# Patient Record
Sex: Female | Born: 1960 | Race: Black or African American | Hispanic: No | Marital: Single | Smoking: Never smoker
Health system: Southern US, Community
[De-identification: ages and names within clinical notes are randomized; demographics above are authoritative.]

## PROBLEM LIST (undated history)

## (undated) DIAGNOSIS — E8881 Metabolic syndrome: Secondary | ICD-10-CM

## (undated) DIAGNOSIS — Z5189 Encounter for other specified aftercare: Secondary | ICD-10-CM

## (undated) DIAGNOSIS — J45909 Unspecified asthma, uncomplicated: Secondary | ICD-10-CM

## (undated) DIAGNOSIS — R0981 Nasal congestion: Secondary | ICD-10-CM

## (undated) DIAGNOSIS — E119 Type 2 diabetes mellitus without complications: Secondary | ICD-10-CM

## (undated) DIAGNOSIS — R238 Other skin changes: Secondary | ICD-10-CM

## (undated) DIAGNOSIS — E7800 Pure hypercholesterolemia, unspecified: Secondary | ICD-10-CM

## (undated) DIAGNOSIS — D481 Neoplasm of uncertain behavior of connective and other soft tissue: Principal | ICD-10-CM

## (undated) DIAGNOSIS — T7840XA Allergy, unspecified, initial encounter: Secondary | ICD-10-CM

## (undated) DIAGNOSIS — E785 Hyperlipidemia, unspecified: Secondary | ICD-10-CM

## (undated) DIAGNOSIS — K219 Gastro-esophageal reflux disease without esophagitis: Secondary | ICD-10-CM

## (undated) DIAGNOSIS — H539 Unspecified visual disturbance: Secondary | ICD-10-CM

## (undated) DIAGNOSIS — R51 Headache: Secondary | ICD-10-CM

## (undated) DIAGNOSIS — R519 Headache, unspecified: Secondary | ICD-10-CM

## (undated) DIAGNOSIS — I10 Essential (primary) hypertension: Secondary | ICD-10-CM

## (undated) DIAGNOSIS — R233 Spontaneous ecchymoses: Secondary | ICD-10-CM

## (undated) DIAGNOSIS — J029 Acute pharyngitis, unspecified: Secondary | ICD-10-CM

## (undated) HISTORY — DX: Nasal congestion: R09.81

## (undated) HISTORY — DX: Acute pharyngitis, unspecified: J02.9

## (undated) HISTORY — DX: Metabolic syndrome: E88.810

## (undated) HISTORY — DX: Pure hypercholesterolemia, unspecified: E78.00

## (undated) HISTORY — DX: Neoplasm of uncertain behavior of connective and other soft tissue: D48.1

## (undated) HISTORY — DX: Other skin changes: R23.8

## (undated) HISTORY — DX: Headache: R51

## (undated) HISTORY — DX: Unspecified visual disturbance: H53.9

## (undated) HISTORY — DX: Essential (primary) hypertension: I10

## (undated) HISTORY — DX: Gastro-esophageal reflux disease without esophagitis: K21.9

## (undated) HISTORY — DX: Metabolic syndrome: E88.81

## (undated) HISTORY — DX: Headache, unspecified: R51.9

## (undated) HISTORY — DX: Allergy, unspecified, initial encounter: T78.40XA

## (undated) HISTORY — DX: Encounter for other specified aftercare: Z51.89

## (undated) HISTORY — DX: Hyperlipidemia, unspecified: E78.5

## (undated) HISTORY — DX: Unspecified asthma, uncomplicated: J45.909

## (undated) HISTORY — DX: Spontaneous ecchymoses: R23.3

## (undated) HISTORY — DX: Type 2 diabetes mellitus without complications: E11.9

---

## 1986-07-03 HISTORY — PX: ABDOMINAL HYSTERECTOMY: SHX81

## 2000-09-03 ENCOUNTER — Other Ambulatory Visit: Admission: RE | Admit: 2000-09-03 | Discharge: 2000-09-03 | Payer: Self-pay | Admitting: Obstetrics and Gynecology

## 2001-04-02 ENCOUNTER — Emergency Department (HOSPITAL_COMMUNITY): Admission: EM | Admit: 2001-04-02 | Discharge: 2001-04-02 | Payer: Self-pay | Admitting: Emergency Medicine

## 2001-04-02 ENCOUNTER — Encounter: Payer: Self-pay | Admitting: Emergency Medicine

## 2002-01-29 ENCOUNTER — Emergency Department (HOSPITAL_COMMUNITY): Admission: EM | Admit: 2002-01-29 | Discharge: 2002-01-29 | Payer: Self-pay | Admitting: Emergency Medicine

## 2004-10-10 ENCOUNTER — Encounter: Admission: RE | Admit: 2004-10-10 | Discharge: 2004-11-08 | Payer: Self-pay | Admitting: Orthopedic Surgery

## 2005-07-28 ENCOUNTER — Ambulatory Visit (HOSPITAL_COMMUNITY): Admission: RE | Admit: 2005-07-28 | Discharge: 2005-07-28 | Payer: Self-pay | Admitting: Gastroenterology

## 2007-08-02 ENCOUNTER — Encounter: Admission: RE | Admit: 2007-08-02 | Discharge: 2007-08-02 | Payer: Self-pay | Admitting: Internal Medicine

## 2007-08-14 ENCOUNTER — Encounter: Admission: RE | Admit: 2007-08-14 | Discharge: 2007-08-14 | Payer: Self-pay

## 2009-12-20 ENCOUNTER — Emergency Department (HOSPITAL_COMMUNITY): Admission: EM | Admit: 2009-12-20 | Discharge: 2009-12-21 | Payer: Self-pay | Admitting: Emergency Medicine

## 2011-03-30 ENCOUNTER — Emergency Department (HOSPITAL_COMMUNITY): Payer: 59

## 2011-03-30 ENCOUNTER — Emergency Department (HOSPITAL_COMMUNITY)
Admission: EM | Admit: 2011-03-30 | Discharge: 2011-03-30 | Disposition: A | Discharge status: Home / Self Care | Payer: 59 | Attending: Emergency Medicine | Admitting: Emergency Medicine

## 2011-03-30 DIAGNOSIS — R079 Chest pain, unspecified: Secondary | ICD-10-CM | POA: Insufficient documentation

## 2011-03-30 DIAGNOSIS — F29 Unspecified psychosis not due to a substance or known physiological condition: Secondary | ICD-10-CM | POA: Insufficient documentation

## 2011-03-30 DIAGNOSIS — R51 Headache: Secondary | ICD-10-CM | POA: Insufficient documentation

## 2011-03-30 DIAGNOSIS — I10 Essential (primary) hypertension: Secondary | ICD-10-CM | POA: Insufficient documentation

## 2011-03-30 DIAGNOSIS — E785 Hyperlipidemia, unspecified: Secondary | ICD-10-CM | POA: Insufficient documentation

## 2011-03-30 DIAGNOSIS — Z79899 Other long term (current) drug therapy: Secondary | ICD-10-CM | POA: Insufficient documentation

## 2011-03-30 LAB — DIFFERENTIAL
Basophils Absolute: 0 10*3/uL (ref 0.0–0.1)
Basophils Relative: 0 % (ref 0–1)
Monocytes Absolute: 0.4 10*3/uL (ref 0.1–1.0)
Neutro Abs: 2.7 10*3/uL (ref 1.7–7.7)
Neutrophils Relative %: 43 % (ref 43–77)

## 2011-03-30 LAB — CBC
Hemoglobin: 12.3 g/dL (ref 12.0–15.0)
MCHC: 32.8 g/dL (ref 30.0–36.0)
Platelets: 273 10*3/uL (ref 150–400)

## 2011-03-30 LAB — COMPREHENSIVE METABOLIC PANEL
ALT: 14 U/L (ref 0–35)
AST: 17 U/L (ref 0–37)
Albumin: 3.5 g/dL (ref 3.5–5.2)
Alkaline Phosphatase: 108 U/L (ref 39–117)
GFR calc Af Amer: >60 mL/min (ref >60)
Glucose, Bld: 95 mg/dL (ref 70–99)
Potassium: 3.7 mEq/L (ref 3.5–5.1)
Sodium: 139 mEq/L (ref 135–145)
Total Protein: 7.8 g/dL (ref 6.0–8.3)

## 2011-03-30 LAB — POCT I-STAT TROPONIN I: Troponin i, poc: 0 ng/mL (ref 0.00–0.08)

## 2011-07-31 ENCOUNTER — Ambulatory Visit (INDEPENDENT_AMBULATORY_CARE_PROVIDER_SITE_OTHER): Payer: 59 | Admitting: Surgery

## 2011-07-31 ENCOUNTER — Encounter (INDEPENDENT_AMBULATORY_CARE_PROVIDER_SITE_OTHER): Payer: Self-pay | Admitting: Surgery

## 2011-07-31 VITALS — BP 154/86 | HR 72 | Temp 98.1°F | Resp 18 | Ht 67.0 in | Wt 294.6 lb

## 2011-07-31 DIAGNOSIS — D4819 Other specified neoplasm of uncertain behavior of connective and other soft tissue: Secondary | ICD-10-CM | POA: Insufficient documentation

## 2011-07-31 DIAGNOSIS — R229 Localized swelling, mass and lump, unspecified: Secondary | ICD-10-CM

## 2011-07-31 DIAGNOSIS — D481 Neoplasm of uncertain behavior of connective and other soft tissue: Secondary | ICD-10-CM

## 2011-07-31 DIAGNOSIS — E66813 Obesity, class 3: Secondary | ICD-10-CM

## 2011-07-31 HISTORY — DX: Other specified neoplasm of uncertain behavior of connective and other soft tissue: D48.19

## 2011-07-31 HISTORY — DX: Neoplasm of uncertain behavior of connective and other soft tissue: D48.1

## 2011-07-31 NOTE — Progress Notes (Signed)
Subjective:     Patient ID: Annette Lindsey, female   DOB: Dec 01, 1960, 51 y.o.   MRN: [id number]  HPI  Annette Lindsey  February 25, 1961 [id number]  Patient Care Team: Geraldo Pitter, MD as PCP - General (Family Medicine)  This patient is a 51 y.o.female who presents today for surgical evaluation at the request of Dr. Parke Simmers.   Reason for visit: Enlarging mass on the right buttock.  Patient is a pleasant obese woman who is felt a mass on her right buttock about 8 months ago. It is gradually gotten larger. It is getting painful. No bleeding or discharge. She's never had a lesion like this before. No history of keloids or scarring. No history of MRSA or other infections. No trauma or fall. No history of lymphadenopathy. Energy level otherwise rather good. She comes today with a significant other  Patient Active Problem List  Diagnoses  . Skin mass, right lateral buttock  . Obesity, Class III, BMI 40-49.9 (morbid obesity)    Past Medical History  Diagnosis Date  . Hypertension   . Hyperlipidemia   . Nasal congestion   . Sore throat   . Visual disturbance   . Chest pain   . Constipation   . Generalized headaches   . Bruises easily     Past Surgical History  Procedure Date  . Abdominal hysterectomy 1988    History   Social History  . Marital Status: Single    Spouse Name: N/A    Number of Children: N/A  . Years of Education: N/A   Occupational History  . Not on file.   Social History Main Topics  . Smoking status: Never Smoker   . Smokeless tobacco: Never Used  . Alcohol Use: No  . Drug Use: No  . Sexually Active: Not on file   Other Topics Concern  . Not on file   Social History Narrative  . No narrative on file    Family History  Problem Relation Age of Onset  . Cancer Father     lung    Current outpatient prescriptions:Amlodipine Besylate-Valsartan (EXFORGE PO), Take 10 mg by mouth daily., Disp: , Rfl:   Allergies  Allergen Reactions  . Other  Anaphylaxis    Patient has this reaction to grass. 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 Shellfish-Derived Products Swelling    Of throat    BP 154/86  Pulse 72  Temp(Src) 98.1 F (36.7 C) (Temporal)  Resp 18  Ht 5\' 7"  (1.702 m)  Wt 294 lb 9.6 oz (133.63 kg)  BMI 46.14 kg/m2     Review of Systems  Constitutional: Negative for fever, chills, diaphoresis, appetite change and fatigue.  HENT: Negative for ear pain, sore throat, trouble swallowing, neck pain and ear discharge.   Eyes: Negative for photophobia, discharge and visual disturbance.  Respiratory: Negative for cough, choking, chest tightness and shortness of breath.   Cardiovascular: Negative for chest pain and palpitations.  Gastrointestinal: Negative for nausea, vomiting, abdominal pain, diarrhea, constipation, anal bleeding and rectal pain.  Genitourinary: Negative for dysuria, frequency and difficulty urinating.  Musculoskeletal: Negative for myalgias and gait problem.  Skin: Negative for color change, pallor, rash and wound.       No h/o keloids, hypertrophic scarring  Neurological: Negative for dizziness, speech difficulty, weakness and numbness.  Hematological: Negative for adenopathy.  Psychiatric/Behavioral: Negative for confusion and agitation. The patient is not nervous/anxious.  Objective:   Physical Exam  Constitutional: She is oriented to person, place, and time. She appears well-developed and well-nourished. No distress.  HENT:  Head: Normocephalic.  Mouth/Throat: Oropharynx is clear and moist. No oropharyngeal exudate.  Eyes: Conjunctivae and EOM are normal. Pupils are equal, round, and reactive to light. No scleral icterus.  Neck: Normal range of motion. Neck supple. No tracheal deviation present.  Cardiovascular: Normal rate, regular rhythm and intact distal pulses.   Pulmonary/Chest: Effort normal and breath sounds normal. No respiratory distress.  She exhibits no tenderness.  Abdominal: Soft. She exhibits no distension and no mass. There is no tenderness. Hernia confirmed negative in the right inguinal area and confirmed negative in the left inguinal area.       Pfannenstiel incision closed - no keloid  Genitourinary: No vaginal discharge found.  Musculoskeletal: Normal range of motion. She exhibits no tenderness.  Lymphadenopathy:    She has no cervical adenopathy.       Right: No inguinal adenopathy present.       Left: No inguinal adenopathy present.  Neurological: She is alert and oriented to person, place, and time. No cranial nerve deficit. She exhibits normal muscle tone. Coordination normal.  Skin: Skin is warm and dry. No rash noted. She is not diaphoretic. No cyanosis or erythema. Nails show no clubbing.     Psychiatric: She has a normal mood and affect. Her behavior is normal. Judgment and thought content normal.       Assessment:     Enlarging mass of skin, prob fibroma (not c/w cyst or lipoma or nevus)    Plan:     Removal.  Given it's location & size, the pt wishes some sedation  The pathophysiology of skin & subcutaneous masses was discussed.  Natural history risks without surgery were discussed.  I recommended surgery to remove the mass.  I explained the technique of removal with use of local anesthesia & possible need for more aggressive sedation/anesthesia for patient comfort.    Risks such as bleeding, infection, heart attack, death, and other risks were discussed.  I noted a good likelihood this will help address the problem.   Possibility that this will not correct all symptoms was explained. Possibility of regrowth/recurrence of the mass was discussed.  We will work to minimize complications. Questions were answered.  The patient expresses understanding & wishes to proceed with surgery.

## 2011-08-04 ENCOUNTER — Encounter (HOSPITAL_COMMUNITY): Payer: Self-pay | Admitting: *Deleted

## 2011-08-04 ENCOUNTER — Emergency Department (HOSPITAL_COMMUNITY)
Admission: EM | Admit: 2011-08-04 | Discharge: 2011-08-04 | Disposition: A | Discharge status: Home / Self Care | Payer: No Typology Code available for payment source | Attending: Emergency Medicine | Admitting: Emergency Medicine

## 2011-08-04 DIAGNOSIS — T148XXA Other injury of unspecified body region, initial encounter: Secondary | ICD-10-CM | POA: Insufficient documentation

## 2011-08-04 DIAGNOSIS — E785 Hyperlipidemia, unspecified: Secondary | ICD-10-CM | POA: Insufficient documentation

## 2011-08-04 DIAGNOSIS — Y92410 Unspecified street and highway as the place of occurrence of the external cause: Secondary | ICD-10-CM | POA: Insufficient documentation

## 2011-08-04 DIAGNOSIS — M62838 Other muscle spasm: Secondary | ICD-10-CM | POA: Insufficient documentation

## 2011-08-04 DIAGNOSIS — I10 Essential (primary) hypertension: Secondary | ICD-10-CM | POA: Insufficient documentation

## 2011-08-04 DIAGNOSIS — R51 Headache: Secondary | ICD-10-CM | POA: Insufficient documentation

## 2011-08-04 DIAGNOSIS — M25519 Pain in unspecified shoulder: Secondary | ICD-10-CM | POA: Insufficient documentation

## 2011-08-04 DIAGNOSIS — M542 Cervicalgia: Secondary | ICD-10-CM | POA: Insufficient documentation

## 2011-08-04 MED ORDER — METHOCARBAMOL 500 MG PO TABS: 1000.0000 mg | ORAL_TABLET | Freq: Four times a day (QID) | ORAL | Status: AC

## 2011-08-04 MED ORDER — IBUPROFEN 800 MG PO TABS
800.0000 mg | ORAL_TABLET | Freq: Once | ORAL | Status: AC
  Administered 2011-08-04: 800 mg via ORAL
  Filled 2011-08-04: qty 1

## 2011-08-04 MED ORDER — IBUPROFEN 800 MG PO TABS: 800.0000 mg | ORAL_TABLET | Freq: Three times a day (TID) | ORAL | Status: AC | PRN
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
## 2011-08-05 NOTE — ED Provider Notes (Signed)
Medical screening examination/treatment/procedure(s) were performed by non-physician practitioner and as supervising physician I was immediately available for consultation/collaboration. Mayzie Caughlin Y.   Gavin Pound. Man Effertz, MD 08/05/11 2302

## 2011-08-18 DIAGNOSIS — D215 Benign neoplasm of connective and other soft tissue of pelvis: Secondary | ICD-10-CM

## 2011-08-18 HISTORY — PX: MASS EXCISION: SHX2000

## 2011-08-21 ENCOUNTER — Telehealth (INDEPENDENT_AMBULATORY_CARE_PROVIDER_SITE_OTHER): Payer: Self-pay

## 2011-08-21 NOTE — Telephone Encounter (Signed)
LMOM giving her po appt with Dr Michaell Cowing but advised if she has stitches or staples in to call me back b/c I would move her appt up earlier if I need to.

## 2011-09-05 ENCOUNTER — Encounter (INDEPENDENT_AMBULATORY_CARE_PROVIDER_SITE_OTHER): Payer: Self-pay | Admitting: Surgery

## 2011-09-12 ENCOUNTER — Encounter (INDEPENDENT_AMBULATORY_CARE_PROVIDER_SITE_OTHER): Payer: Self-pay

## 2011-09-12 ENCOUNTER — Ambulatory Visit (INDEPENDENT_AMBULATORY_CARE_PROVIDER_SITE_OTHER): Payer: 59 | Admitting: Surgery

## 2011-09-12 ENCOUNTER — Encounter (INDEPENDENT_AMBULATORY_CARE_PROVIDER_SITE_OTHER): Payer: Self-pay | Admitting: Surgery

## 2011-09-12 VITALS — BP 138/86 | HR 108 | Temp 97.2°F | Resp 18 | Ht 67.0 in | Wt 296.2 lb

## 2011-09-12 DIAGNOSIS — D481 Neoplasm of uncertain behavior of connective and other soft tissue: Secondary | ICD-10-CM

## 2011-09-12 NOTE — Progress Notes (Signed)
Subjective:     Patient ID: Annette Lindsey, female   DOB: 1960-12-27, 51 y.o.   MRN: [id number]  HPI   DANIELL PARADISE  Dec 11, 1960 [id number]  Patient Care Team: Geraldo Pitter, MD as PCP - General (Family Medicine)  This patient is a 52 y.o.female who presents today for surgical evaluation at the request of Dr. Parke Simmers.   Reason for visit: Enlarging mass on the right buttock.  Patient is a pleasant obese woman who is felt a mass on her right buttock about 8 months ago. It is gradually gotten larger. It is getting painful. No bleeding or discharge. She's never had a lesion like this before. No history of keloids or scarring. No history of MRSA or other infections. No trauma or fall. No history of lymphadenopathy. Energy level otherwise rather good. She comes today with a significant other  Patient Active Problem List  Diagnoses  . Atypical fibroxanthoma right buttock  . Obesity, Class III, BMI 40-49.9 (morbid obesity)    Past Medical History  Diagnosis Date  . Hypertension   . Hyperlipidemia   . Nasal congestion   . Sore throat   . Visual disturbance   . Chest pain   . Constipation   . Generalized headaches   . Bruises easily   . 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 Abdominal hysterectomy 1988  . Mass excision 08/18/11    right buttock    History   Social History  . Marital Status: Single    Spouse Name: N/A    Number of Children: N/A  . Years of Education: N/A   Occupational History  . Not on file.   Social History Main Topics  . Smoking status: Never Smoker   . Smokeless tobacco: Never Used  . Alcohol Use: No  . Drug Use: No  . Sexually Active: Not on file   Other Topics Concern  . Not on file   Social History Narrative  . No narrative on file    Family History  Problem Relation Age of Onset  . Cancer Father     lung  . Heart disease Mother     Current outpatient  prescriptions:Amlodipine Besylate-Valsartan (EXFORGE PO), Take 10 mg by mouth daily., Disp: , Rfl: ;  atorvastatin (LIPITOR) 40 MG tablet, Take 40 mg by mouth daily., Disp: , Rfl:   Allergies  Allergen Reactions  . Other Anaphylaxis    Patient has this reaction to grass. 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 Shellfish-Derived Products Swelling    Of throat    BP 138/86  Pulse 108  Temp(Src) 97.2 F (36.2 C) (Temporal)  Resp 18  Ht 5\' 7"  (1.702 m)  Wt 296 lb 3.2 oz (134.355 kg)  BMI 46.39 kg/m2     Review of Systems  Constitutional: Negative for fever, chills, diaphoresis, appetite change and fatigue.  HENT: Negative for ear pain, sore throat, trouble swallowing, neck pain and ear discharge.   Eyes: Negative for photophobia, discharge and visual disturbance.  Respiratory: Negative for cough, choking, chest tightness and shortness of breath.   Cardiovascular: Negative for chest pain and palpitations.  Gastrointestinal: Negative for nausea, vomiting, abdominal pain, diarrhea, constipation, anal bleeding and rectal pain.  Genitourinary: Negative for dysuria, frequency and difficulty urinating.  Musculoskeletal: Negative for myalgias and gait problem.  Skin: Negative for color change, pallor, rash and wound.  No h/o keloids, hypertrophic scarring  Neurological: Negative for dizziness, speech difficulty, weakness and numbness.  Hematological: Negative for adenopathy.  Psychiatric/Behavioral: Negative for confusion and agitation. The patient is not nervous/anxious.        Objective:   Physical Exam  Constitutional: She is oriented to person, place, and time. She appears well-developed and well-nourished. No distress.  HENT:  Head: Normocephalic.  Mouth/Throat: Oropharynx is clear and moist. No oropharyngeal exudate.  Eyes: Conjunctivae and EOM are normal. Pupils are equal, round, and reactive to light. No scleral icterus.    Neck: Normal range of motion. Neck supple. No tracheal deviation present.  Cardiovascular: Normal rate, regular rhythm and intact distal pulses.   Pulmonary/Chest: Effort normal and breath sounds normal. No respiratory distress. She exhibits no tenderness.  Abdominal: Soft. She exhibits no distension and no mass. There is no tenderness. Hernia confirmed negative in the right inguinal area and confirmed negative in the left inguinal area.       Pfannenstiel incision closed - no keloid  Genitourinary: No vaginal discharge found.  Musculoskeletal: Normal range of motion. She exhibits no tenderness.  Lymphadenopathy:    She has no cervical adenopathy.       Right: No inguinal adenopathy present.       Left: No inguinal adenopathy present.  Neurological: She is alert and oriented to person, place, and time. No cranial nerve deficit. She exhibits normal muscle tone. Coordination normal.  Skin: Skin is warm and dry. No rash noted. She is not diaphoretic. No cyanosis or erythema. Nails show no clubbing.     Psychiatric: She has a normal mood and affect. Her behavior is normal. Judgment and thought content normal.       Assessment:     Atypical fibroxanthoma of right gluteal region    Plan:     It doesn't seem that this is typical for a woman of African descent (tends to be elderly of European descent). It is also not in any sun-exposed area. Nonetheless the pathology was re-reviewed  In reviewing the literature, the patient is best served with better margins. Review of the literature notes that [facility] notes 2cm margins are best. It is in a location where I can do that. I do not think she will require Moh's Surgery as I can have enough tissue to get better margins. Lymph node metastasis risk is very small. She wishes to proceed with surgery.  Given it's location & size, the pt wishes some sedation  The pathophysiology of skin & subcutaneous masses was discussed.  Natural history risks without  surgery were discussed.  I recommended surgery to remove the mass.  I explained the technique of removal with use of local anesthesia & possible need for more aggressive sedation/anesthesia for patient comfort.    Risks such as bleeding, infection, heart attack, death, and other risks were discussed.  I noted a good likelihood this will help address the problem.   Possibility that this will not correct all symptoms was explained. Possibility of regrowth/recurrence of the mass was discussed.  We will work to minimize complications. Questions were answered.  The patient expresses understanding & wishes to proceed with surgery.

## 2011-09-12 NOTE — Patient Instructions (Signed)
You have a ATYPICAL FIBROXANTHOMA.  It requires better surgical margins = you need re-excision

## 2011-09-19 ENCOUNTER — Other Ambulatory Visit (INDEPENDENT_AMBULATORY_CARE_PROVIDER_SITE_OTHER): Payer: Self-pay | Admitting: Surgery

## 2011-09-19 DIAGNOSIS — D4819 Other specified neoplasm of uncertain behavior of connective and other soft tissue: Secondary | ICD-10-CM

## 2011-09-19 DIAGNOSIS — D481 Neoplasm of uncertain behavior of connective and other soft tissue: Secondary | ICD-10-CM

## 2011-09-21 ENCOUNTER — Encounter (INDEPENDENT_AMBULATORY_CARE_PROVIDER_SITE_OTHER): Payer: Self-pay

## 2011-09-21 ENCOUNTER — Telehealth (INDEPENDENT_AMBULATORY_CARE_PROVIDER_SITE_OTHER): Payer: Self-pay | Admitting: General Surgery

## 2011-09-21 NOTE — Telephone Encounter (Signed)
Message copied by Liliana Cline on Thu Sep 21, 2011  5:33 PM ------      Message from: Ardeth Sportsman      Created: Thu Sep 21, 2011  5:13 PM       Tell pt the good news!  Margins are clear

## 2011-09-21 NOTE — Telephone Encounter (Signed)
Patient made aware of path results. Will follow up at appt and call with any questions prior.  

## 2011-10-09 ENCOUNTER — Encounter (INDEPENDENT_AMBULATORY_CARE_PROVIDER_SITE_OTHER): Payer: Self-pay | Admitting: Surgery

## 2011-10-09 ENCOUNTER — Ambulatory Visit (INDEPENDENT_AMBULATORY_CARE_PROVIDER_SITE_OTHER): Payer: 59 | Admitting: Surgery

## 2011-10-09 VITALS — BP 138/90 | HR 84 | Temp 97.6°F | Resp 18 | Ht 67.0 in | Wt 298.2 lb

## 2011-10-09 DIAGNOSIS — D481 Neoplasm of uncertain behavior of connective and other soft tissue: Secondary | ICD-10-CM

## 2011-10-09 NOTE — Progress Notes (Signed)
Subjective:     Patient ID: Annette Lindsey, female   DOB: [birth year], 51 y.o.   MRN: [id number]  HPI   [name]  June 17, 1961 [id number]  Patient Care Team: Geraldo Pitter, MD as PCP - General (Family Medicine)  This patient is a 51 y.o.female who presents today for surgical evaluation at the request of Dr. Parke Simmers.   Reason for visit: Enlarging mass on the right buttock.  Pathology: Fibroxanthoma  Procedure: Reexcision of fibroxanthoma R buttock margins 09/19/2011  The patient feels much better. Mild soreness resolving.   Wound not opened up. Walking well. Back to work. She can sit on the area.  Patient Active Problem List  Diagnoses  . Atypical fibroxanthoma right buttock  . Obesity, Class III, BMI 40-49.9 (morbid obesity)    Past Medical History  Diagnosis Date  . Hypertension   . Hyperlipidemia   . Nasal congestion   . Sore throat   . Visual disturbance   . Chest pain   . Constipation   . Generalized headaches   . Bruises easily   . 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 Abdominal hysterectomy 1988  . Mass excision 08/18/11    right buttock    History   Social History  . Marital Status: Single    Spouse Name: N/A    Number of Children: N/A  . Years of Education: N/A   Occupational History  . Not on file.   Social History Main Topics  . Smoking status: Never Smoker   . Smokeless tobacco: Never Used  . Alcohol Use: No  . Drug Use: No  . Sexually Active: Not on file   Other Topics Concern  . Not on file   Social History Narrative  . No narrative on file    Family History  Problem Relation Age of Onset  . Cancer Father     lung  . Heart disease Mother     Current outpatient prescriptions:Amlodipine Besylate-Valsartan (EXFORGE PO), Take 10 mg by mouth daily., Disp: , Rfl: ;  Rosuvastatin Calcium (CRESTOR PO), Take by mouth daily., Disp: , Rfl:   Allergies  Allergen Reactions  .  Other Anaphylaxis    Patient has this reaction to grass. 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 Shellfish-Derived Products Swelling    Of throat    BP 138/90  Pulse 84  Temp(Src) 97.6 F (36.4 C) (Temporal)  Resp 18  Ht 5\' 7"  (1.702 m)  Wt 298 lb 3.2 oz (135.263 kg)  BMI 46.70 kg/m2     Review of Systems  Constitutional: Negative for fever, chills, diaphoresis, appetite change and fatigue.  HENT: Negative for ear pain, sore throat, trouble swallowing, neck pain and ear discharge.   Eyes: Negative for photophobia, discharge and visual disturbance.  Respiratory: Negative for cough, choking, chest tightness and shortness of breath.   Cardiovascular: Negative for chest pain and palpitations.  Gastrointestinal: Negative for nausea, vomiting, abdominal pain, diarrhea, constipation, anal bleeding and rectal pain.  Genitourinary: Negative for dysuria, frequency and difficulty urinating.  Musculoskeletal: Negative for myalgias and gait problem.  Skin: Negative for color change, pallor, rash and wound.       No h/o keloids, hypertrophic scarring  Neurological: Negative for dizziness, speech difficulty, weakness and numbness.  Hematological: Negative for adenopathy.  Psychiatric/Behavioral: Negative for confusion and agitation. The patient is not nervous/anxious.  Objective:   Physical Exam  Constitutional: She is oriented to person, place, and time. She appears well-developed and well-nourished. No distress.  HENT:  Head: Normocephalic.  Mouth/Throat: Oropharynx is clear and moist. No oropharyngeal exudate.  Eyes: Conjunctivae and EOM are normal. Pupils are equal, round, and reactive to light. No scleral icterus.  Neck: Normal range of motion. Neck supple. No tracheal deviation present.  Cardiovascular: Normal rate, regular rhythm and intact distal pulses.   Pulmonary/Chest: Effort normal and breath sounds normal. No respiratory  distress. She exhibits no tenderness.  Abdominal: Soft. She exhibits no distension and no mass. There is no tenderness. Hernia confirmed negative in the right inguinal area and confirmed negative in the left inguinal area.       Pfannenstiel incision closed - no keloid  Genitourinary: No vaginal discharge found.  Musculoskeletal: Normal range of motion. She exhibits no tenderness.  Lymphadenopathy:    She has no cervical adenopathy.       Right: No inguinal adenopathy present.       Left: No inguinal adenopathy present.  Neurological: She is alert and oriented to person, place, and time. No cranial nerve deficit. She exhibits normal muscle tone. Coordination normal.  Skin: Skin is warm and dry. No rash noted. She is not diaphoretic. No cyanosis or erythema. Nails show no clubbing.     Psychiatric: She has a normal mood and affect. Her behavior is normal. Judgment and thought content normal.       Assessment:     Atypical fibroxanthoma of right gluteal region, margin >2cm now & clear    Plan:     Increase activity as tolerated.  Do not push through pain.  Exam of the skin/scar regularly. Make sure there is no recurrence in the future.  Return to clinic p.r.n. The patient expressed understanding and appreciation

## 2012-05-09 ENCOUNTER — Other Ambulatory Visit: Payer: Self-pay | Admitting: Family Medicine

## 2012-05-09 ENCOUNTER — Ambulatory Visit
Admission: RE | Admit: 2012-05-09 | Discharge: 2012-05-09 | Disposition: A | Discharge status: Home / Self Care | Payer: 59 | Source: Ambulatory Visit | Attending: Family Medicine | Admitting: Family Medicine

## 2012-05-09 DIAGNOSIS — R7611 Nonspecific reaction to tuberculin skin test without active tuberculosis: Secondary | ICD-10-CM

## 2012-07-23 ENCOUNTER — Emergency Department (HOSPITAL_COMMUNITY): Payer: 59

## 2012-07-23 ENCOUNTER — Emergency Department (HOSPITAL_COMMUNITY)
Admission: EM | Admit: 2012-07-23 | Discharge: 2012-07-23 | Disposition: A | Discharge status: Home / Self Care | Payer: 59 | Attending: Emergency Medicine | Admitting: Emergency Medicine

## 2012-07-23 ENCOUNTER — Encounter (HOSPITAL_COMMUNITY): Payer: Self-pay | Admitting: *Deleted

## 2012-07-23 DIAGNOSIS — Z9119 Patient's noncompliance with other medical treatment and regimen: Secondary | ICD-10-CM | POA: Insufficient documentation

## 2012-07-23 DIAGNOSIS — I160 Hypertensive urgency: Secondary | ICD-10-CM

## 2012-07-23 DIAGNOSIS — Z79899 Other long term (current) drug therapy: Secondary | ICD-10-CM | POA: Insufficient documentation

## 2012-07-23 DIAGNOSIS — Z8669 Personal history of other diseases of the nervous system and sense organs: Secondary | ICD-10-CM | POA: Insufficient documentation

## 2012-07-23 DIAGNOSIS — E785 Hyperlipidemia, unspecified: Secondary | ICD-10-CM | POA: Insufficient documentation

## 2012-07-23 DIAGNOSIS — Z8709 Personal history of other diseases of the respiratory system: Secondary | ICD-10-CM | POA: Insufficient documentation

## 2012-07-23 DIAGNOSIS — Z8719 Personal history of other diseases of the digestive system: Secondary | ICD-10-CM | POA: Insufficient documentation

## 2012-07-23 DIAGNOSIS — Z3202 Encounter for pregnancy test, result negative: Secondary | ICD-10-CM | POA: Insufficient documentation

## 2012-07-23 DIAGNOSIS — R209 Unspecified disturbances of skin sensation: Secondary | ICD-10-CM | POA: Insufficient documentation

## 2012-07-23 DIAGNOSIS — Z91199 Patient's noncompliance with other medical treatment and regimen due to unspecified reason: Secondary | ICD-10-CM | POA: Insufficient documentation

## 2012-07-23 DIAGNOSIS — I10 Essential (primary) hypertension: Secondary | ICD-10-CM | POA: Insufficient documentation

## 2012-07-23 DIAGNOSIS — R0789 Other chest pain: Secondary | ICD-10-CM | POA: Insufficient documentation

## 2012-07-23 LAB — CBC
HCT: 40.5 % (ref 36.0–46.0)
MCH: 26.8 pg (ref 26.0–34.0)
MCHC: 32.6 g/dL (ref 30.0–36.0)
MCV: 82.2 fL (ref 78.0–100.0)
RDW: 14.6 % (ref 11.5–15.5)

## 2012-07-23 LAB — URINALYSIS, ROUTINE W REFLEX MICROSCOPIC
Glucose, UA: NEGATIVE mg/dL
Hgb urine dipstick: NEGATIVE
Ketones, ur: NEGATIVE mg/dL
Leukocytes, UA: NEGATIVE
Protein, ur: NEGATIVE mg/dL
pH: 8 (ref 5.0–8.0)

## 2012-07-23 LAB — BASIC METABOLIC PANEL
BUN: 11 mg/dL (ref 6–23)
Creatinine, Ser: 0.7 mg/dL (ref 0.50–1.10)
GFR calc Af Amer: >90 mL/min (ref >90)
GFR calc non Af Amer: >90 mL/min (ref >90)

## 2012-07-23 MED ORDER — HYDRALAZINE HCL 20 MG/ML IJ SOLN
10.0000 mg | INTRAMUSCULAR | Status: AC
  Administered 2012-07-23: 10 mg via INTRAVENOUS
  Filled 2012-07-23: qty 0.5

## 2012-07-23 NOTE — ED Notes (Signed)
Pt reports chest numbess x3 days. Chest pain 6/10 started today on left side. Denies SOB. Reports "chest heaviness". Pt reports she has not taken her HTN meds in at least 1 month

## 2012-07-23 NOTE — ED Provider Notes (Signed)
History     CSN: [id number]  Arrival date & time 07/23/12  7829   First MD Initiated Contact with Patient 07/23/12 1022      Chief Complaint  Patient presents with  . Chest Pain    (Consider location/radiation/quality/duration/timing/severity/associated sxs/prior treatment) HPI The patient presents with multiple complaints the most prominently, the patient complains of chest pain and left arm dysesthesia over the past 2 days.  Onset was unremarkable.  Since onset she said pain persistently in her left anterior chest, sore, nonradiating.  There is also numbness throughout the left arm.  No weakness, or pain.  Prior to the onset of the symptoms the patient notes that she had a URI like illness for the past several days. The patient states that she has not taken any medication in several weeks, including her blood pressure medication.  Past Medical History  Diagnosis Date  . Hypertension   . Hyperlipidemia   . Nasal congestion   . Sore throat   . Visual disturbance   . Chest pain   . Constipation   . Generalized headaches   . Bruises easily   . 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 Abdominal hysterectomy 1988  . Mass excision 08/18/11    right buttock    Family History  Problem Relation Age of Onset  . Cancer Father     lung  . Heart disease Mother     History  Substance Use Topics  . Smoking status: Never Smoker   . Smokeless tobacco: Never Used  . Alcohol Use: No    OB History    Grav Para Term Preterm Abortions TAB SAB Ect Mult Living                  Review of Systems  Constitutional:       Per HPI, otherwise negative  HENT:       Per HPI, otherwise negative  Eyes: Negative.   Respiratory:       Per HPI, otherwise negative  Cardiovascular:       Per HPI, otherwise negative  Gastrointestinal: Negative for vomiting.  Genitourinary: Negative.   Musculoskeletal:       Per HPI, otherwise negative    Skin: Negative.   Neurological: Negative for syncope.    Allergies  Other and Shellfish-derived products  Home Medications   Current Outpatient Rx  Name  Route  Sig  Dispense  Refill  . AMLODIPINE BESYLATE-VALSARTAN 10-160 MG PO TABS   Oral   Take 1 tablet by mouth every morning.         Marland Kitchen LINAGLIPTIN 5 MG PO TABS   Oral   Take 5 mg by mouth every morning.         Marland Kitchen ROSUVASTATIN CALCIUM 10 MG PO TABS   Oral   Take 10 mg by mouth every evening.           BP 201/92  Pulse 75  Temp 97.9 F (36.6 C)  Resp 20  SpO2 99%  Physical Exam  Nursing note and vitals reviewed. Constitutional: She is oriented to person, place, and time. She appears well-developed and well-nourished. No distress.  HENT:  Head: Normocephalic and atraumatic.  Eyes: Conjunctivae normal and EOM are normal.  Cardiovascular: Normal rate and regular rhythm.   Pulmonary/Chest: Effort normal and breath sounds normal. No stridor. No respiratory distress.  Abdominal: She exhibits no distension.  Musculoskeletal: She exhibits no edema.  Neurological: She is alert and oriented to person, place, and time. No cranial nerve deficit.  Skin: Skin is warm and dry.  Psychiatric: She has a normal mood and affect.    ED Course  Procedures (including critical care time)   Labs Reviewed  CBC  BASIC METABOLIC PANEL  POCT I-STAT TROPONIN I  LIPASE, BLOOD  URINALYSIS, ROUTINE W REFLEX MICROSCOPIC   No results found.   No diagnosis found.  Cardiac 75 sinus rhythm normal Pulse ox 99% room air normal Patient's initial blood pressures 200 systolic   Date: 40/98/1191  Rate: 90  Rhythm: normal sinus rhythm  QRS Axis: normal  Intervals: normal  ST/T Wave abnormalities: normal  Conduction Disutrbances: none  Narrative Interpretation: unremarkable   12:24 PM Patient resting comfortably - nad. She was informed of all results.  BP 150 / 90    MDM  This generally well-appearing female with  multiple medical problems, including hypertension, no medication compliance presents with ongoing chest pain, left arm numbness.  Given her description of no medication use for her blood pressure, and her initial blood pressure greater than 200 systolic, there suspicion of hypertensive urgency.  The chronicity of the symptoms, with with no lab evidence of ongoing coronary ischemia suggests absence of this phenomena.  The patient's lack of distress in general, reassuring vital signs following medications, are further reassurance.  We discussed, at length, the need for PMD f/u and med compliance.         Gerhard Munch, MD 07/23/12 1226

## 2012-07-23 NOTE — ED Notes (Signed)
md at bedside  Pt alert and oriented x4. Respirations even and unlabored, bilateral symmetrical rise and fall of chest. Skin warm and dry. In no acute distress. Denies needs.   

## 2012-07-30 ENCOUNTER — Encounter (HOSPITAL_COMMUNITY): Payer: Self-pay | Admitting: Emergency Medicine

## 2012-07-30 ENCOUNTER — Emergency Department (HOSPITAL_COMMUNITY)
Admission: EM | Admit: 2012-07-30 | Discharge: 2012-07-30 | Disposition: A | Discharge status: Home / Self Care | Payer: 59 | Attending: Emergency Medicine | Admitting: Emergency Medicine

## 2012-07-30 DIAGNOSIS — Z8669 Personal history of other diseases of the nervous system and sense organs: Secondary | ICD-10-CM | POA: Insufficient documentation

## 2012-07-30 DIAGNOSIS — I10 Essential (primary) hypertension: Secondary | ICD-10-CM | POA: Insufficient documentation

## 2012-07-30 DIAGNOSIS — E785 Hyperlipidemia, unspecified: Secondary | ICD-10-CM | POA: Insufficient documentation

## 2012-07-30 DIAGNOSIS — E669 Obesity, unspecified: Secondary | ICD-10-CM | POA: Insufficient documentation

## 2012-07-30 DIAGNOSIS — R079 Chest pain, unspecified: Secondary | ICD-10-CM | POA: Insufficient documentation

## 2012-07-30 DIAGNOSIS — B029 Zoster without complications: Secondary | ICD-10-CM | POA: Insufficient documentation

## 2012-07-30 DIAGNOSIS — Z8709 Personal history of other diseases of the respiratory system: Secondary | ICD-10-CM | POA: Insufficient documentation

## 2012-07-30 DIAGNOSIS — Z8719 Personal history of other diseases of the digestive system: Secondary | ICD-10-CM | POA: Insufficient documentation

## 2012-07-30 DIAGNOSIS — Z79899 Other long term (current) drug therapy: Secondary | ICD-10-CM | POA: Insufficient documentation

## 2012-07-30 MED ORDER — VALACYCLOVIR HCL 500 MG PO TABS
1000.0000 mg | ORAL_TABLET | Freq: Once | ORAL | Status: AC
  Administered 2012-07-30: 1000 mg via ORAL
  Filled 2012-07-30: qty 2

## 2012-07-30 MED ORDER — PREDNISONE 20 MG PO TABS: ORAL_TABLET | ORAL | Status: DC

## 2012-07-30 MED ORDER — PREDNISONE 20 MG PO TABS
60.0000 mg | ORAL_TABLET | Freq: Once | ORAL | Status: AC
  Administered 2012-07-30: 60 mg via ORAL
  Filled 2012-07-30: qty 3

## 2012-07-30 MED ORDER — VALACYCLOVIR HCL 1 G PO TABS: 1000.0000 mg | ORAL_TABLET | Freq: Three times a day (TID) | ORAL | Status: AC

## 2012-07-30 NOTE — ED Provider Notes (Signed)
History     CSN: [id number]  Arrival date & time 07/30/12  1904   First MD Initiated Contact with Patient 07/30/12 1930      Chief Complaint  Patient presents with  . Chest Pain  . Rash    (Consider location/radiation/quality/duration/timing/severity/associated sxs/prior treatment) HPI Comments: 52 y/o female with a hx of HTN and hyperlipidemia presents to the ED complaining of intermittent chest pain for the past 2 weeks. She was seen in the ED 1 week ago without any significant findings. Describes the pain as "electricity" and heavy pressure rated 8/10. Noting in specific makes the pain come or go. Lasts about 15 minutes when it is present. Pain located on L side of her chest and in her L arm. Occasionally pain is intense it is hard to breath but denies shortness of breath. States a rash appeared 2 days ago on the left side of her chest and her arm that is itchy and painful. No relief with ibuprofen.  Patient is a 52 y.o. female presenting with chest pain and rash. The history is provided by the patient and the spouse.  Chest Pain Pertinent negatives for primary symptoms include no shortness of breath, no nausea and no vomiting.  Pertinent negatives for associated symptoms include no diaphoresis.    Rash     Past Medical History  Diagnosis Date  . Hypertension   . Hyperlipidemia   . Nasal congestion   . Sore throat   . Visual disturbance   . Chest pain   . Constipation   . Generalized headaches   . Bruises easily   . 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 Abdominal hysterectomy 1988  . Mass excision 08/18/11    right buttock    Family History  Problem Relation Age of Onset  . Cancer Father     lung  . Heart disease Mother     History  Substance Use Topics  . Smoking status: Never Smoker   . Smokeless tobacco: Never Used  . Alcohol Use: No    OB History    Grav Para Term Preterm Abortions TAB SAB Ect  Mult Living                  Review of Systems  Constitutional: Negative for diaphoresis.  Respiratory: Negative for shortness of breath.   Cardiovascular: Positive for chest pain.  Gastrointestinal: Negative for nausea and vomiting.  Skin: Positive for rash.  All other systems reviewed and are negative.    Allergies  Other and Shellfish-derived products  Home Medications   Current Outpatient Rx  Name  Route  Sig  Dispense  Refill  . AMLODIPINE BESYLATE-VALSARTAN 10-160 MG PO TABS   Oral   Take 1 tablet by mouth every morning.         Marland Kitchen LINAGLIPTIN 5 MG PO TABS   Oral   Take 5 mg by mouth every morning.         Marland Kitchen ROSUVASTATIN CALCIUM 10 MG PO TABS   Oral   Take 10 mg by mouth every evening.           BP 142/80  Pulse 100  Temp 98 F (36.7 C) (Oral)  Resp 20  SpO2 100%  Physical Exam  Constitutional: She appears well-developed. No distress.       Obese  HENT:  Head: Normocephalic and atraumatic.  Mouth/Throat: Oropharynx is clear and moist.  Eyes: Conjunctivae normal and  EOM are normal. Pupils are equal, round, and reactive to light.  Neck: Normal range of motion. Neck supple.  Cardiovascular: Normal rate, regular rhythm, normal heart sounds and intact distal pulses.   Pulmonary/Chest: Effort normal and breath sounds normal. She has no decreased breath sounds. She has no wheezes. She has no rhonchi. She has no rales.    Abdominal: Soft. Bowel sounds are normal. There is no tenderness.  Musculoskeletal: Normal range of motion. She exhibits no edema.  Skin: Skin is warm.     Psychiatric: She has a normal mood and affect. Her behavior is normal.    ED Course  Procedures (including critical care time)  Labs Reviewed - No data to display No results found.   1. Shingles   2. Chest pain       MDM  52 y/o female with shingles. Chest pain x 2 weeks on L side and L arm with rash appearing 2 days ago. Rash tender to palpation. Workup done in ED  at her last visit unremarkable. She did begin taking blood pressure medication as advised at that time. Rx valtrex and prednisone. Infection care and precautions discussed. Return precautions discussed. Patient states understanding of plan and is agreeable. Case discussed with Dr. Rubin Payor who also evaluated patient and agrees with plan of care.        Trevor Mace, PA-C 07/30/12 2015

## 2012-07-30 NOTE — ED Notes (Signed)
Pt states she has had recurrent chest pain for the past 2 weeks  Pt states the pain is intermittent and sometimes feels like an electric current and other times feels like a heavy pressure making it hard to breathe  Pt states she was seen for same about 2 weeks ago and it never went away  Pt also is c/o rash that itches  States it started 2 days ago and has used benadryl without relief

## 2012-07-30 NOTE — ED Provider Notes (Signed)
Medical screening examination/treatment/procedure(s) were conducted as a shared visit with non-physician practitioner(s) and myself.  I personally evaluated the patient during the encounter. Patient with chest pain and new rash along the location of the pain. Seen a week ago for similar symptoms and no rashes since developed. It is more of an indurated rash, however this is likely zoster. Patient will be discharged home  Juliet Rude. Rubin Payor, MD 07/30/12 2227

## 2013-11-25 ENCOUNTER — Ambulatory Visit: Payer: 59

## 2013-12-02 ENCOUNTER — Ambulatory Visit: Payer: 59

## 2013-12-09 ENCOUNTER — Ambulatory Visit: Payer: 59

## 2013-12-15 ENCOUNTER — Other Ambulatory Visit: Payer: Self-pay

## 2013-12-15 ENCOUNTER — Other Ambulatory Visit: Payer: Self-pay | Admitting: Internal Medicine

## 2013-12-15 DIAGNOSIS — N63 Unspecified lump in unspecified breast: Secondary | ICD-10-CM

## 2013-12-15 DIAGNOSIS — Z1231 Encounter for screening mammogram for malignant neoplasm of breast: Secondary | ICD-10-CM

## 2013-12-25 ENCOUNTER — Encounter: Payer: 59 | Attending: Internal Medicine

## 2013-12-25 VITALS — Ht 68.0 in | Wt 298.4 lb

## 2013-12-25 DIAGNOSIS — E119 Type 2 diabetes mellitus without complications: Secondary | ICD-10-CM | POA: Insufficient documentation

## 2013-12-25 DIAGNOSIS — Z713 Dietary counseling and surveillance: Secondary | ICD-10-CM | POA: Insufficient documentation

## 2013-12-25 NOTE — Progress Notes (Signed)
Patient was seen on 12/25/13 for the first of a series of three diabetes self-management courses at the [facility].  Current HbA1c: 6.5%  The following learning objectives were met by the patient during this class:  Describe diabetes  State some common risk factors for diabetes  Defines the role of glucose and insulin  Identifies type of diabetes and pathophysiology  Describe the relationship between diabetes and cardiovascular risk  State the members of the Healthcare Team  States the rationale for glucose monitoring  State when to test glucose  State their individual Target Range  State the importance of logging glucose readings  Describe how to interpret glucose readings  Identifies A1C target  Explain the correlation between A1c and eAG values  State symptoms and treatment of high blood glucose  State symptoms and treatment of low blood glucose  Explain proper technique for glucose testing  Identifies proper sharps disposal  Handouts given during class include:  Living Well with Diabetes book  Carb Counting and Meal Planning book  Meal Plan Card  Carbohydrate guide  Meal planning worksheet  Low Sodium Flavoring Tips  The diabetes portion plate  L8G to eAG Conversion Chart  Diabetes Medications  Diabetes Recommended Care Schedule  Support Group  Diabetes Success Plan  Core Class Satisfaction Survey  Follow-Up Plan:  Attend core 2

## 2014-01-01 ENCOUNTER — Ambulatory Visit: Payer: 59

## 2014-01-05 ENCOUNTER — Other Ambulatory Visit: Payer: Self-pay | Admitting: Internal Medicine

## 2014-01-05 ENCOUNTER — Ambulatory Visit
Admission: RE | Admit: 2014-01-05 | Discharge: 2014-01-05 | Disposition: A | Discharge status: Home / Self Care | Payer: 59 | Source: Ambulatory Visit | Attending: Internal Medicine | Admitting: Internal Medicine

## 2014-01-05 ENCOUNTER — Ambulatory Visit: Payer: 59

## 2014-01-05 ENCOUNTER — Ambulatory Visit
Admission: RE | Admit: 2014-01-05 | Discharge: 2014-01-05 | Disposition: A | Discharge status: Home / Self Care | Payer: 59 | Source: Ambulatory Visit

## 2014-01-05 DIAGNOSIS — N63 Unspecified lump in unspecified breast: Secondary | ICD-10-CM

## 2015-03-01 ENCOUNTER — Emergency Department (HOSPITAL_COMMUNITY): Payer: 59

## 2015-03-01 ENCOUNTER — Encounter (HOSPITAL_COMMUNITY): Payer: Self-pay

## 2015-03-01 ENCOUNTER — Inpatient Hospital Stay (HOSPITAL_COMMUNITY)
Admission: EM | Admit: 2015-03-01 | Discharge: 2015-03-03 | DRG: 871 | Disposition: A | Discharge status: Home / Self Care | Payer: 59 | Attending: Internal Medicine | Admitting: Internal Medicine

## 2015-03-01 DIAGNOSIS — Z6841 Body Mass Index (BMI) 40.0 and over, adult: Secondary | ICD-10-CM | POA: Diagnosis not present

## 2015-03-01 DIAGNOSIS — N179 Acute kidney failure, unspecified: Secondary | ICD-10-CM | POA: Diagnosis present

## 2015-03-01 DIAGNOSIS — Z7952 Long term (current) use of systemic steroids: Secondary | ICD-10-CM

## 2015-03-01 DIAGNOSIS — J180 Bronchopneumonia, unspecified organism: Secondary | ICD-10-CM | POA: Diagnosis present

## 2015-03-01 DIAGNOSIS — Z79899 Other long term (current) drug therapy: Secondary | ICD-10-CM | POA: Diagnosis not present

## 2015-03-01 DIAGNOSIS — E1169 Type 2 diabetes mellitus with other specified complication: Secondary | ICD-10-CM

## 2015-03-01 DIAGNOSIS — E860 Dehydration: Secondary | ICD-10-CM | POA: Diagnosis present

## 2015-03-01 DIAGNOSIS — J45909 Unspecified asthma, uncomplicated: Secondary | ICD-10-CM | POA: Diagnosis present

## 2015-03-01 DIAGNOSIS — A419 Sepsis, unspecified organism: Principal | ICD-10-CM | POA: Diagnosis present

## 2015-03-01 DIAGNOSIS — R0789 Other chest pain: Secondary | ICD-10-CM | POA: Diagnosis not present

## 2015-03-01 DIAGNOSIS — J189 Pneumonia, unspecified organism: Secondary | ICD-10-CM | POA: Diagnosis not present

## 2015-03-01 DIAGNOSIS — I10 Essential (primary) hypertension: Secondary | ICD-10-CM

## 2015-03-01 DIAGNOSIS — K5900 Constipation, unspecified: Secondary | ICD-10-CM | POA: Diagnosis present

## 2015-03-01 DIAGNOSIS — E785 Hyperlipidemia, unspecified: Secondary | ICD-10-CM

## 2015-03-01 DIAGNOSIS — E119 Type 2 diabetes mellitus without complications: Secondary | ICD-10-CM | POA: Diagnosis present

## 2015-03-01 DIAGNOSIS — Z91013 Allergy to seafood: Secondary | ICD-10-CM

## 2015-03-01 DIAGNOSIS — G4733 Obstructive sleep apnea (adult) (pediatric): Secondary | ICD-10-CM | POA: Diagnosis present

## 2015-03-01 LAB — COMPREHENSIVE METABOLIC PANEL
ALK PHOS: 73 U/L (ref 38–126)
ALT: 20 U/L (ref 14–54)
ALT: 17 U/L (ref 14–54)
ANION GAP: 5 (ref 5–15)
AST: 38 U/L (ref 15–41)
AST: 23 U/L (ref 15–41)
Albumin: 3.8 g/dL (ref 3.5–5.0)
Albumin: 3.4 g/dL — ABNORMAL LOW (ref 3.5–5.0)
Alkaline Phosphatase: 83 U/L (ref 38–126)
Anion gap: 9 (ref 5–15)
BUN: 12 mg/dL (ref 6–20)
BUN: 9 mg/dL (ref 6–20)
CALCIUM: 7.8 mg/dL — AB (ref 8.9–10.3)
CHLORIDE: 102 mmol/L (ref 101–111)
CHLORIDE: 112 mmol/L — AB (ref 101–111)
CO2: 26 mmol/L (ref 22–32)
CO2: 24 mmol/L (ref 22–32)
CREATININE: 1.17 mg/dL — AB (ref 0.44–1.00)
Calcium: 9.2 mg/dL (ref 8.9–10.3)
Creatinine, Ser: 0.94 mg/dL (ref 0.44–1.00)
GFR calc Af Amer: >60 mL/min (ref >60)
GFR calc non Af Amer: >60 mL/min (ref >60)
GFR, EST NON AFRICAN AMERICAN: 53 mL/min — AB (ref >60)
Glucose, Bld: 233 mg/dL — ABNORMAL HIGH (ref 65–99)
Glucose, Bld: 89 mg/dL (ref 65–99)
Potassium: 3.4 mmol/L — ABNORMAL LOW (ref 3.5–5.1)
Potassium: 3.6 mmol/L (ref 3.5–5.1)
SODIUM: 141 mmol/L (ref 135–145)
Sodium: 137 mmol/L (ref 135–145)
Total Bilirubin: 0.2 mg/dL — ABNORMAL LOW (ref 0.3–1.2)
Total Bilirubin: 0.3 mg/dL (ref 0.3–1.2)
Total Protein: 8.1 g/dL (ref 6.5–8.1)
Total Protein: 7.1 g/dL (ref 6.5–8.1)

## 2015-03-01 LAB — CBC WITH DIFFERENTIAL/PLATELET
Basophils Absolute: 0 10*3/uL (ref 0.0–0.1)
Basophils Absolute: 0 10*3/uL (ref 0.0–0.1)
Basophils Relative: 0 % (ref 0–1)
Basophils Relative: 0 % (ref 0–1)
EOS ABS: 0.1 10*3/uL (ref 0.0–0.7)
EOS ABS: 0.1 10*3/uL (ref 0.0–0.7)
EOS PCT: 1 % (ref 0–5)
EOS PCT: 1 % (ref 0–5)
HCT: 40.2 % (ref 36.0–46.0)
HCT: 36.5 % (ref 36.0–46.0)
Hemoglobin: 12.9 g/dL (ref 12.0–15.0)
Hemoglobin: 11.8 g/dL — ABNORMAL LOW (ref 12.0–15.0)
LYMPHS ABS: 1.7 10*3/uL (ref 0.7–4.0)
LYMPHS ABS: 2.5 10*3/uL (ref 0.7–4.0)
Lymphocytes Relative: 20 % (ref 12–46)
Lymphocytes Relative: 32 % (ref 12–46)
MCH: 26.9 pg (ref 26.0–34.0)
MCH: 27.3 pg (ref 26.0–34.0)
MCHC: 32.1 g/dL (ref 30.0–36.0)
MCHC: 32.3 g/dL (ref 30.0–36.0)
MCV: 83.8 fL (ref 78.0–100.0)
MCV: 84.3 fL (ref 78.0–100.0)
MONOS PCT: 4 % (ref 3–12)
MONOS PCT: 7 % (ref 3–12)
Monocytes Absolute: 0.3 10*3/uL (ref 0.1–1.0)
Monocytes Absolute: 0.5 10*3/uL (ref 0.1–1.0)
Neutro Abs: 6.3 10*3/uL (ref 1.7–7.7)
Neutro Abs: 4.6 10*3/uL (ref 1.7–7.7)
Neutrophils Relative %: 75 % (ref 43–77)
Neutrophils Relative %: 60 % (ref 43–77)
PLATELETS: 247 10*3/uL (ref 150–400)
PLATELETS: 187 10*3/uL (ref 150–400)
RBC: 4.8 MIL/uL (ref 3.87–5.11)
RBC: 4.33 MIL/uL (ref 3.87–5.11)
RDW: 14.5 % (ref 11.5–15.5)
RDW: 14.6 % (ref 11.5–15.5)
WBC: 8.5 10*3/uL (ref 4.0–10.5)
WBC: 7.7 10*3/uL (ref 4.0–10.5)

## 2015-03-01 LAB — URINALYSIS, ROUTINE W REFLEX MICROSCOPIC
GLUCOSE, UA: NEGATIVE mg/dL
Ketones, ur: NEGATIVE mg/dL
Nitrite: NEGATIVE
PH: 6 (ref 5.0–8.0)
Protein, ur: 30 mg/dL — AB
Specific Gravity, Urine: 1.026 (ref 1.005–1.030)
Urobilinogen, UA: 1 mg/dL (ref 0.0–1.0)

## 2015-03-01 LAB — URINE MICROSCOPIC-ADD ON

## 2015-03-01 LAB — PROTIME-INR
INR: 1.09 (ref 0.00–1.49)
PROTHROMBIN TIME: 14.3 s (ref 11.6–15.2)

## 2015-03-01 LAB — APTT: aPTT: 81 seconds — ABNORMAL HIGH (ref 24–37)

## 2015-03-01 LAB — PROCALCITONIN

## 2015-03-01 LAB — I-STAT TROPONIN, ED: Troponin i, poc: 0 ng/mL (ref 0.00–0.08)

## 2015-03-01 LAB — I-STAT CG4 LACTIC ACID, ED
LACTIC ACID, VENOUS: 1.08 mmol/L (ref 0.5–2.0)
Lactic Acid, Venous: 3.36 mmol/L (ref 0.5–2.0)

## 2015-03-01 LAB — LACTIC ACID, PLASMA: LACTIC ACID, VENOUS: 0.9 mmol/L (ref 0.5–2.0)

## 2015-03-01 MED ORDER — SODIUM CHLORIDE 0.9 % IV BOLUS (SEPSIS)
500.0000 mL | Freq: Once | INTRAVENOUS | Status: AC
  Administered 2015-03-01: 500 mL via INTRAVENOUS

## 2015-03-01 MED ORDER — ACETAMINOPHEN 650 MG RE SUPP: 650.0000 mg | Freq: Four times a day (QID) | RECTAL | Status: DC | PRN

## 2015-03-01 MED ORDER — DEXTROSE 5 % IV SOLN: 1.0000 g | Freq: Once | INTRAVENOUS | Status: DC

## 2015-03-01 MED ORDER — ONDANSETRON HCL 4 MG/2ML IJ SOLN: 4.0000 mg | Freq: Four times a day (QID) | INTRAMUSCULAR | Status: DC | PRN

## 2015-03-01 MED ORDER — ONDANSETRON HCL 4 MG PO TABS: 4.0000 mg | ORAL_TABLET | Freq: Four times a day (QID) | ORAL | Status: DC | PRN

## 2015-03-01 MED ORDER — IRBESARTAN 150 MG PO TABS
150.0000 mg | ORAL_TABLET | Freq: Every day | ORAL | Status: DC
  Administered 2015-03-02 – 2015-03-03 (×2): 150 mg via ORAL
  Filled 2015-03-01 (×2): qty 1

## 2015-03-01 MED ORDER — SODIUM CHLORIDE 0.9 % IV BOLUS (SEPSIS): 500.0000 mL | INTRAVENOUS | Status: AC

## 2015-03-01 MED ORDER — VITAMIN D (ERGOCALCIFEROL) 1.25 MG (50000 UNIT) PO CAPS: 50000.0000 [IU] | ORAL_CAPSULE | ORAL | Status: DC

## 2015-03-01 MED ORDER — AMLODIPINE BESYLATE-VALSARTAN 10-160 MG PO TABS: 1.0000 | ORAL_TABLET | Freq: Every morning | ORAL | Status: DC

## 2015-03-01 MED ORDER — SODIUM CHLORIDE 0.9 % IV BOLUS (SEPSIS)
1000.0000 mL | Freq: Once | INTRAVENOUS | Status: AC
  Administered 2015-03-01: 1000 mL via INTRAVENOUS

## 2015-03-01 MED ORDER — SODIUM CHLORIDE 0.9 % IJ SOLN: 3.0000 mL | Freq: Two times a day (BID) | INTRAMUSCULAR | Status: DC

## 2015-03-01 MED ORDER — SODIUM CHLORIDE 0.9 % IV BOLUS (SEPSIS): 1000.0000 mL | INTRAVENOUS | Status: DC

## 2015-03-01 MED ORDER — ACETAMINOPHEN 325 MG PO TABS
650.0000 mg | ORAL_TABLET | Freq: Four times a day (QID) | ORAL | Status: DC | PRN
  Administered 2015-03-02 – 2015-03-03 (×2): 650 mg via ORAL
  Filled 2015-03-01 (×2): qty 2

## 2015-03-01 MED ORDER — DEXTROSE 5 % IV SOLN
500.0000 mg | INTRAVENOUS | Status: DC
  Administered 2015-03-02: 500 mg via INTRAVENOUS
  Filled 2015-03-01: qty 500

## 2015-03-01 MED ORDER — ACETAMINOPHEN 500 MG PO TABS
1000.0000 mg | ORAL_TABLET | Freq: Once | ORAL | Status: AC
  Administered 2015-03-01: 1000 mg via ORAL
  Filled 2015-03-01: qty 2

## 2015-03-01 MED ORDER — SODIUM CHLORIDE 0.9 % IV SOLN
INTRAVENOUS | Status: DC
  Administered 2015-03-01 – 2015-03-02 (×2): via INTRAVENOUS

## 2015-03-01 MED ORDER — CEFTRIAXONE SODIUM 1 G IJ SOLR
1.0000 g | Freq: Once | INTRAMUSCULAR | Status: AC
  Administered 2015-03-01: 1 g via INTRAVENOUS
  Filled 2015-03-01: qty 10

## 2015-03-01 MED ORDER — ONDANSETRON HCL 4 MG/2ML IJ SOLN
4.0000 mg | Freq: Once | INTRAMUSCULAR | Status: AC
  Administered 2015-03-01: 4 mg via INTRAVENOUS

## 2015-03-01 MED ORDER — DEXTROSE 5 % IV SOLN
500.0000 mg | Freq: Once | INTRAVENOUS | Status: AC
  Administered 2015-03-01: 500 mg via INTRAVENOUS
  Filled 2015-03-01: qty 500

## 2015-03-01 MED ORDER — HEPARIN SODIUM (PORCINE) 5000 UNIT/ML IJ SOLN
5000.0000 [IU] | Freq: Three times a day (TID) | INTRAMUSCULAR | Status: DC
  Administered 2015-03-02 – 2015-03-03 (×4): 5000 [IU] via SUBCUTANEOUS
  Filled 2015-03-01 (×4): qty 1

## 2015-03-01 MED ORDER — LINAGLIPTIN 5 MG PO TABS
5.0000 mg | ORAL_TABLET | Freq: Every morning | ORAL | Status: DC
  Administered 2015-03-02: 5 mg via ORAL
  Filled 2015-03-01: qty 1

## 2015-03-01 MED ORDER — AMLODIPINE BESYLATE 10 MG PO TABS
10.0000 mg | ORAL_TABLET | Freq: Every day | ORAL | Status: DC
  Administered 2015-03-02 – 2015-03-03 (×2): 10 mg via ORAL
  Filled 2015-03-01 (×2): qty 1

## 2015-03-01 MED ORDER — ROSUVASTATIN CALCIUM 10 MG PO TABS
10.0000 mg | ORAL_TABLET | Freq: Every evening | ORAL | Status: DC
  Administered 2015-03-02: 10 mg via ORAL
  Filled 2015-03-01: qty 1

## 2015-03-01 MED ORDER — DEXTROSE 5 % IV SOLN
1.0000 g | INTRAVENOUS | Status: DC
  Administered 2015-03-02: 1 g via INTRAVENOUS
  Filled 2015-03-01 (×2): qty 10

## 2015-03-01 MED ORDER — DEXTROSE 5 % IV SOLN: 500.0000 mg | Freq: Once | INTRAVENOUS | Status: DC

## 2015-03-01 MED ORDER — ONDANSETRON HCL 4 MG/2ML IJ SOLN
INTRAMUSCULAR | Status: AC
Start: 2015-03-01 — End: 2015-03-02
  Filled 2015-03-01: qty 2

## 2015-03-01 NOTE — ED Notes (Signed)
Bed: RT02 Expected date:  Expected time:  Means of arrival:  Comments: Save for triage 1

## 2015-03-01 NOTE — H&P (Signed)
[facility] History and Physical  Annette Lindsey ZOX:[id number] DOB: 05/21/1961 DOA: 03/01/2015  Referring physician: Quintella Reichert, MD PCP: Elyn Peers, MD   Chief Complaint: Chest Pain fever  HPI: Annette Lindsey is a 54 y.o. female with prior history of HTN HLD DM II presents with fever and chest pain. She states that she started to have a fever about a day ago. Prior to this she had had cough and chest pain on Friday. She states that she was not bringing up any sputum. She also states that she also noted increased shortness of breath. She states that she does not smoke. She also admits to some wheeze too. She states that this morning she started to have pain in her chest and has progressed through the day. She states that she pain was more like a pressure. She states there were no relieving factore and no aggravating factors. She does not have any recent ill contacts and she has not traveled anywhere. She has no nausea noted. In the ED she did vomit once. She states that she has not been eating very well either. She has a history of hypertension but she has not been taking her medications. In the ED she was noted to have increased heart rate and tachycardia with an elevated blood pressure along with fever. Her CXR shows presence of    Review of Systems:  12 point ROS performed and is unremarkable other than HPI.   Past Medical History  Diagnosis Date  . Hypertension   . Hyperlipidemia   . Nasal congestion   . Sore throat   . Visual disturbance   . Chest pain   . Constipation   . Generalized headaches   . Bruises easily   . 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 Diabetes mellitus without complication   . Asthma    Past Surgical History  Procedure Laterality Date  . Abdominal hysterectomy  1988  . Mass excision  08/18/11    right buttock   Social History:  reports that she has never smoked. She has never used smokeless tobacco. She reports that she  does not drink alcohol or use illicit drugs.  Allergies  Allergen Reactions  . Other Anaphylaxis    Patient has this reaction to grass. 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 Shellfish-Derived Products Swelling    Of throat    Family History  Problem Relation Age of Onset  . Cancer Father     lung  . Heart disease Mother      Prior to Admission medications   Medication Sig Start Date End Date Taking? Authorizing Provider  amLODipine-valsartan (EXFORGE) 10-160 MG per tablet Take 1 tablet by mouth every morning.   Yes Historical Provider, MD  linagliptin (TRADJENTA) 5 MG TABS tablet Take 5 mg by mouth every morning.   Yes Historical Provider, MD  rosuvastatin (CRESTOR) 10 MG tablet Take 10 mg by mouth every evening.   Yes Historical Provider, MD  Vitamin D, Ergocalciferol, (DRISDOL) 50000 UNITS CAPS capsule Take 50,000 Units by mouth 2 (two) times a week.   Yes Historical Provider, MD  predniSONE (DELTASONE) 20 MG tablet Take 3 tabs PO x 2 days followed by 2 tabs PO x 2 days followed by 1 tab PO x 2 days Patient not taking: Reported on 03/01/2015 07/30/12   Carman Ching, PA-C   Physical Exam: Filed Vitals:   03/01/15 1723 03/01/15 1804  BP:  231/96   Pulse: 139   Temp: 101.7 F (38.7 C)   TempSrc: Oral   Resp: 32   Height:  5\' 6"  (1.676 m)  Weight:  135.172 kg (298 lb)  SpO2: 96%     Wt Readings from Last 3 Encounters:  03/01/15 135.172 kg (298 lb)  12/25/13 135.353 kg (298 lb 6.4 oz)  10/09/11 135.263 kg (298 lb 3.2 oz)    General:  Appears calm and comfortable Eyes: PERRL, normal lids, irises & conjunctiva ENT: grossly normal hearing, lips & tongue Neck: no LAD, masses or thyromegaly Cardiovascular: RRR, no m/r/g tachycardic Respiratory: CTA bilaterally, no w/r/r Abdomen: soft, ntnd Skin: no rash or induration seen on limited exam Musculoskeletal: grossly normal tone BUE/BLE Psychiatric: grossly normal mood and  affect Neurologic: grossly non-focal.          Labs on Admission:  Basic Metabolic Panel:  Recent Labs Lab 03/01/15 1821  NA 137  K 3.4*  CL 102  CO2 26  GLUCOSE 233*  BUN 12  CREATININE 1.17*  CALCIUM 9.2   Liver Function Tests:  Recent Labs Lab 03/01/15 1821  AST 38  ALT 20  ALKPHOS 83  BILITOT 0.2*  PROT 8.1  ALBUMIN 3.8   No results for input(s): LIPASE, AMYLASE in the last 168 hours. No results for input(s): AMMONIA in the last 168 hours. CBC:  Recent Labs Lab 03/01/15 1821  WBC 8.5  NEUTROABS 6.3  HGB 12.9  HCT 40.2  MCV 83.8  PLT 247   Cardiac Enzymes: No results for input(s): CKTOTAL, CKMB, CKMBINDEX, TROPONINI in the last 168 hours.  BNP (last 3 results) No results for input(s): BNP in the last 8760 hours.  ProBNP (last 3 results) No results for input(s): PROBNP in the last 8760 hours.  CBG: No results for input(s): GLUCAP in the last 168 hours.  Radiological Exams on Admission: Dg Chest Port 1 View  03/01/2015   CLINICAL DATA:  One week history of nonproductive cough. Left-sided chest pain, shortness of breath and dizziness which began yesterday. Current history of hypertension, diabetes and asthma.  EXAM: PORTABLE CHEST - 1 VIEW  COMPARISON:  07/23/2012 and earlier.  FINDINGS: Cardiac silhouette upper normal in size for technique, unchanged. Thoracic aorta mildly tortuous, unchanged. Hilar and mediastinal contours otherwise unremarkable. Streaky and patchy opacities at the right lung base. Lungs otherwise clear. No pleural effusions. Normal pulmonary vascularity. No pneumothorax.  IMPRESSION: Atelectasis and/or bronchopneumonia involving the right lung base.   Electronically Signed   By: Evangeline Dakin M.D.   On: 03/01/2015 18:24      Assessment/Plan Principal Problem:   Sepsis Active Problems:   CAP (community acquired pneumonia)   HTN (hypertension)   HLD (hyperlipidemia)   Diabetes mellitus without  complication   1. Sepsis -Code sepsis initiated in ED -start on empiric antibiotics for CAP as there is infiltrate on CXR -currently she is not hypotensive will monitor pressures  2. CAP -started on empiric antibiotics rocephin and azithromycin -follow up on cultures of blood and sputum -will get strep antigen and legionella antigen  3. HTN -currently pressures are not controlled due to patient not taking her meds for last 3-4 days -will resume her exforge and monitor pressures  4. HLD -will continue with crestor  5. DM without complications but not controlled -she is on lnagliptin will continue -monitor FSBS and start on SSI as needed -will also check A1C  6. AKI -likely secondary to dehydration -started on IVF follow up on  labs   Code Status: FUll Code (must indicate code status--if unknown or must be presumed, indicate so) DVT Prophylaxis:heparin Family Communication: none (indicate person spoken with, if applicable, with phone number if by telephone) Disposition Plan: home (indicate anticipated LOS    [facility] Pager [phone]

## 2015-03-01 NOTE — ED Provider Notes (Signed)
CSN: [id number]     Arrival date & time 03/01/15  1718 History   First MD Initiated Contact with Patient 03/01/15 1736     Chief Complaint  Patient presents with  . Chest Pain  . Fever     Patient is a 54 y.o. female presenting with chest pain and fever. The history is provided by the patient. No language interpreter was used.  Chest Pain Associated symptoms: fever   Fever Associated symptoms: chest pain    Annette Lindsey presents for evaluation of fever and cough.  Sxs started three days ago with cough, fever, chills, SOB.  Her cough is nonproductive.  Today she developed left sided chest pain.  The pain is described as a pressure/twinge sensation.  The pain is constant in nature.  Denies vomiting, diarrhea, sick contacts.  Decreased appetite.  She has ongoing intermittent lower extremity edema.  Past Medical History  Diagnosis Date  . Hypertension   . Hyperlipidemia   . Nasal congestion   . Sore throat   . Visual disturbance   . Chest pain   . Constipation   . Generalized headaches   . Bruises easily   . 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 Diabetes mellitus without complication   . Asthma    Past Surgical History  Procedure Laterality Date  . Abdominal hysterectomy  1988  . Mass excision  08/18/11    right buttock   Family History  Problem Relation Age of Onset  . Cancer Father     lung  . Heart disease Mother    Social History  Substance Use Topics  . Smoking status: Never Smoker   . Smokeless tobacco: Never Used  . Alcohol Use: No   OB History    No data available     Review of Systems  Constitutional: Positive for fever.  Cardiovascular: Positive for chest pain.  All other systems reviewed and are negative.     Allergies  Other and Shellfish-derived products  Home Medications   Prior to Admission medications   Medication Sig Start Date End Date Taking? Authorizing Provider  amLODipine-valsartan (EXFORGE) 10-160 MG per tablet Take 1  tablet by mouth every morning.   Yes Historical Provider, MD  linagliptin (TRADJENTA) 5 MG TABS tablet Take 5 mg by mouth every morning.   Yes Historical Provider, MD  rosuvastatin (CRESTOR) 10 MG tablet Take 10 mg by mouth every evening.   Yes Historical Provider, MD  Vitamin D, Ergocalciferol, (DRISDOL) 50000 UNITS CAPS capsule Take 50,000 Units by mouth 2 (two) times a week.   Yes Historical Provider, MD  predniSONE (DELTASONE) 20 MG tablet Take 3 tabs PO x 2 days followed by 2 tabs PO x 2 days followed by 1 tab PO x 2 days Patient not taking: Reported on 03/01/2015 07/30/12   Robyn M Hess, PA-C   BP 158/90 mmHg  Pulse 100  Temp(Src) 98.3 F (36.8 C) (Oral)  Resp 22  Ht 5\' 6"  (1.676 m)  Wt 304 lb 6.4 oz (138.075 kg)  BMI 49.15 kg/m2  SpO2 100% Physical Exam  Constitutional: She is oriented to person, place, and time. She appears well-developed and well-nourished. She appears distressed.  HENT:  Head: Normocephalic and atraumatic.  Cardiovascular: Regular rhythm.   No murmur heard. tachycardic  Pulmonary/Chest: Breath sounds normal.  tachpneic  Abdominal: Soft. There is no tenderness. There is no rebound and no guarding.  Musculoskeletal: She exhibits no edema or tenderness.  Neurological: She is alert and oriented  to person, place, and time.  Skin: Skin is warm and dry.  Psychiatric: She has a normal mood and affect. Her behavior is normal.  Nursing note and vitals reviewed.   ED Course  Procedures (including critical care time) Labs Review Labs Reviewed  COMPREHENSIVE METABOLIC PANEL - Abnormal; Notable for the following:    Potassium 3.4 (*)    Glucose, Bld 233 (*)    Creatinine, Ser 1.17 (*)    Total Bilirubin 0.2 (*)    GFR calc non Af Amer 53 (*)    All other components within normal limits  URINALYSIS, ROUTINE W REFLEX MICROSCOPIC (NOT AT [facility]) - Abnormal; Notable for the following:    Color, Urine AMBER (*)    APPearance CLOUDY (*)    Hgb urine dipstick TRACE  (*)    Bilirubin Urine SMALL (*)    Protein, ur 30 (*)    Leukocytes, UA SMALL (*)    All other components within normal limits  URINE MICROSCOPIC-ADD ON - Abnormal; Notable for the following:    Bacteria, UA MANY (*)    All other components within normal limits  CBC WITH DIFFERENTIAL/PLATELET - Abnormal; Notable for the following:    Hemoglobin 11.8 (*)    All other components within normal limits  COMPREHENSIVE METABOLIC PANEL - Abnormal; Notable for the following:    Chloride 112 (*)    Calcium 7.8 (*)    Albumin 3.4 (*)    All other components within normal limits  APTT - Abnormal; Notable for the following:    aPTT 81 (*)    All other components within normal limits  I-STAT CG4 LACTIC ACID, ED - Abnormal; Notable for the following:    Lactic Acid, Venous 3.36 (*)    All other components within normal limits  CULTURE, BLOOD (ROUTINE X 2)  CULTURE, BLOOD (ROUTINE X 2)  URINE CULTURE  CULTURE, EXPECTORATED SPUTUM-ASSESSMENT  GRAM STAIN  CBC WITH DIFFERENTIAL/PLATELET  LACTIC ACID, PLASMA  PROCALCITONIN  PROTIME-INR  HIV ANTIBODY (ROUTINE TESTING)  LEGIONELLA ANTIGEN, URINE  STREP PNEUMONIAE URINARY ANTIGEN  LACTIC ACID, PLASMA  CBC  TSH  HEMOGLOBIN A1C  COMPREHENSIVE METABOLIC PANEL  CBC  I-STAT TROPOININ, ED  I-STAT CG4 LACTIC ACID, ED    Imaging Review Dg Chest Port 1 View  03/01/2015   CLINICAL DATA:  One week history of nonproductive cough. Left-sided chest pain, shortness of breath and dizziness which began yesterday. Current history of hypertension, diabetes and asthma.  EXAM: PORTABLE CHEST - 1 VIEW  COMPARISON:  07/23/2012 and earlier.  FINDINGS: Cardiac silhouette upper normal in size for technique, unchanged. Thoracic aorta mildly tortuous, unchanged. Hilar and mediastinal contours otherwise unremarkable. Streaky and patchy opacities at the right lung base. Lungs otherwise clear. No pleural effusions. Normal pulmonary vascularity. No pneumothorax.   IMPRESSION: Atelectasis and/or bronchopneumonia involving the right lung base.   Electronically Signed   By: Evangeline Dakin M.D.   On: 03/01/2015 18:24   I have personally reviewed and evaluated these images and lab results as part of my medical decision-making.   EKG Interpretation   Date/Time:  Monday March 01 2015 17:24:14 EDT Ventricular Rate:  138 PR Interval:  132 QRS Duration: 76 QT Interval:  298 QTC Calculation: 451 R Axis:   66 Text Interpretation:  Sinus tachycardia Nonspecific ST abnormality  Abnormal ECG Confirmed by Hazle Coca [phone]) on 03/01/2015 5:43:19 PM      MDM   Final diagnoses:  Sepsis, due to unspecified organism  CAP (  community acquired pneumonia)    Patient here for evaluation of fever, chest pain, shortness breath and cough. Patient is ill-appearing on examination. Chest x-ray and history consistent with pneumonia. Treated with antibiotics for 24 pneumonia and IV fluids. Patient is improved on recheck but does have persistent tachycardia. Discussed with hospitalist regarding admission for sepsis with community acquired pneumonia. Discussed with patient findings studies and information for further treatment.    Annette Reichert, MD 03/02/15 [phone]

## 2015-03-01 NOTE — Progress Notes (Signed)
ANTIBIOTIC CONSULT NOTE - INITIAL  Pharmacy Consult for Azithromycin / Ceftriaxone Indication: CAP  Allergies  Allergen Reactions  . Other Anaphylaxis    Patient has this reaction to grass. 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 Shellfish-Derived Products Swelling    Of throat    Patient Measurements: Height: 5\' 6"  (167.6 cm) Weight: (!) 304 lb 6.4 oz (138.075 kg) IBW/kg (Calculated) : 59.3 Adjusted Body Weight:   Vital Signs: Temp: 98.3 F (36.8 C) (08/29 2133) Temp Source: Oral (08/29 2133) BP: 158/90 mmHg (08/29 2133) Pulse Rate: 100 (08/29 2133) Intake/Output from previous day:   Intake/Output from this shift: Total I/O In: 2300 [IV Piggyback:2300] Out: -   Labs:  Recent Labs  03/01/15 1821  WBC 8.5  HGB 12.9  PLT 247  CREATININE 1.17*   Estimated Creatinine Clearance: 80.6 mL/min (by C-G formula based on Cr of 1.17). No results for input(s): VANCOTROUGH, VANCOPEAK, VANCORANDOM, GENTTROUGH, GENTPEAK, GENTRANDOM, TOBRATROUGH, TOBRAPEAK, TOBRARND, AMIKACINPEAK, AMIKACINTROU, AMIKACIN in the last 72 hours.   Microbiology: Recent Results (from the past 720 hour(s))  Blood Culture (routine x 2)     Status: None (Preliminary result)   Collection Time: 03/01/15  6:30 PM  Result Value Ref Range Status   Specimen Description   Final    BLOOD RIGHT FOREARM Performed at [facility]    Special Requests BOTTLES DRAWN AEROBIC AND ANAEROBIC [facility]  Final   Culture PENDING  Incomplete   Report Status PENDING  Incomplete    Medical History: Past Medical History  Diagnosis Date  . Hypertension   . Hyperlipidemia   . Nasal congestion   . Sore throat   . Visual disturbance   . Chest pain   . Constipation   . Generalized headaches   . Bruises easily   . 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 Diabetes mellitus without complication   . Asthma    Assessment: 52 yoF presents with one week history  of non productive cough, fever and chest pain.  Chest x-ray shows atelectasis and/or bronchopneumonia involving right lung base.  Pharmacy consulted to start dose azithromycin and ceftriaxone for CAP.  No allergies noted to antibiotics.  Urine, blood cultures collected.  WBC WNL.  Tm24h: 101.7.   Goal of Therapy:  Eradication of infection  Plan:  First doses of ceftriaxone and azithromycin given in ED.  Continue ceftriaxone 1g IV q24h x total 7 days Continue azithromycin 500mg  IV q24h x total 7 days  Pharmacy will sign-off.  Please re-consult if needed.  Thanks!  Akshat Minehart E 03/01/2015,10:30 PM

## 2015-03-01 NOTE — ED Notes (Signed)
Patient c/o left chest pain since yesterday. Patient also c/o SOB, dizziness. Patient has a non productive cough x 1 week.

## 2015-03-02 ENCOUNTER — Inpatient Hospital Stay (HOSPITAL_COMMUNITY): Payer: 59

## 2015-03-02 DIAGNOSIS — R0789 Other chest pain: Secondary | ICD-10-CM

## 2015-03-02 LAB — CBC
HEMATOCRIT: 37.5 % (ref 36.0–46.0)
HEMOGLOBIN: 12 g/dL (ref 12.0–15.0)
MCH: 27 pg (ref 26.0–34.0)
MCHC: 32 g/dL (ref 30.0–36.0)
MCV: 84.3 fL (ref 78.0–100.0)
Platelets: 234 10*3/uL (ref 150–400)
RBC: 4.45 MIL/uL (ref 3.87–5.11)
RDW: 14.6 % (ref 11.5–15.5)
WBC: 7.2 10*3/uL (ref 4.0–10.5)

## 2015-03-02 LAB — COMPREHENSIVE METABOLIC PANEL
ALBUMIN: 3.3 g/dL — AB (ref 3.5–5.0)
ALT: 15 U/L (ref 14–54)
ANION GAP: 8 (ref 5–15)
AST: 27 U/L (ref 15–41)
Alkaline Phosphatase: 71 U/L (ref 38–126)
BUN: 8 mg/dL (ref 6–20)
CHLORIDE: 110 mmol/L (ref 101–111)
CO2: 24 mmol/L (ref 22–32)
Calcium: 8.1 mg/dL — ABNORMAL LOW (ref 8.9–10.3)
Creatinine, Ser: 0.92 mg/dL (ref 0.44–1.00)
GFR calc Af Amer: >60 mL/min (ref >60)
GFR calc non Af Amer: >60 mL/min (ref >60)
GLUCOSE: 121 mg/dL — AB (ref 65–99)
POTASSIUM: 3.7 mmol/L (ref 3.5–5.1)
Sodium: 142 mmol/L (ref 135–145)
TOTAL PROTEIN: 7 g/dL (ref 6.5–8.1)
Total Bilirubin: 0.8 mg/dL (ref 0.3–1.2)

## 2015-03-02 LAB — STREP PNEUMONIAE URINARY ANTIGEN: STREP PNEUMO URINARY ANTIGEN: NEGATIVE

## 2015-03-02 LAB — TROPONIN I: Troponin I: <0.03 ng/mL (ref <0.031)

## 2015-03-02 LAB — LACTIC ACID, PLASMA: LACTIC ACID, VENOUS: 1.3 mmol/L (ref 0.5–2.0)

## 2015-03-02 LAB — GLUCOSE, CAPILLARY
GLUCOSE-CAPILLARY: 88 mg/dL (ref 65–99)
Glucose-Capillary: 115 mg/dL — ABNORMAL HIGH (ref 65–99)

## 2015-03-02 LAB — TSH: TSH: 2.48 u[IU]/mL (ref 0.350–4.500)

## 2015-03-02 LAB — HIV ANTIBODY (ROUTINE TESTING W REFLEX): HIV SCREEN 4TH GENERATION: NONREACTIVE

## 2015-03-02 MED ORDER — HYDROCOD POLST-CPM POLST ER 10-8 MG/5ML PO SUER
5.0000 mL | Freq: Two times a day (BID) | ORAL | Status: DC | PRN
  Administered 2015-03-02 (×2): 5 mL via ORAL
  Filled 2015-03-02 (×3): qty 5

## 2015-03-02 MED ORDER — INSULIN ASPART 100 UNIT/ML ~~LOC~~ SOLN: 0.0000 [IU] | Freq: Three times a day (TID) | SUBCUTANEOUS | Status: DC

## 2015-03-02 NOTE — Progress Notes (Signed)
PROGRESS NOTE    Annette Lindsey UXL:[id number] DOB: December 20, 1960 DOA: 03/01/2015 PCP: Elyn Peers, MD  HPI/Brief narrative 54 year old female patient with history of HTN, HLD, DM 2, morbid obesity, OSA-did not qualify for CPAP, asthma, presented to the [facility] on 03/01/15 with complaints of fever, nonproductive cough, dyspnea, wheezing. She also complained of upper midsternal pressure-like intermittent chest pain, 4/10 in severity, nonradiating, no aggravating or relieving factors-had it for 2 days then resolved spontaneously last night, no cyclic contacts or recent travel. In the ED, met sepsis criteria-temperature 101.7, BP 231/96 (had not been taking her meds due to illness), heart rate 139 and respiratory rate 32/m. Chest x-ray suggested right lung base bronchopneumonia. Admitted for sepsis secondary to community-acquired pneumonia.   Assessment/Plan:  Sepsis - Present on admission. - Secondary to community-acquired pneumonia. - Treated per sepsis protocol with IV fluids and IV antibiotics - Blood cultures 2: Negative. Urine cultures negative to date. - Improved.  Right lung base bronchopneumonia/CAP - Continue empirically started IV Rocephin and azithromycin. - HIV antibody screen negative - Urine streptococcal antigen: Negative - will need follow-up chest x-ray in 4-6 weeks to ensure resolution of pneumonia findings  Atypical chest pain - EKG: On admission sinus tachycardia at 1 38 bpm, normal axis, nonspecific ST-T wave changes without acute abnormalities and QTC 451 ms. - POC troponin 1: negative/zero - We'll cycle troponins and follow-up EKG in a.m.  Essential hypertension - Mildly uncontrolled. Patient missed meds for 3-4 days PTA - Resumed Exforge. Monitor   HLD - Continue statins  Type II DM - DC oral medications. SSI.  OSA - tested several years ago- no CPAP advised  Morbid Obesity    DVT prophylaxis: subcutaneous Heparin Code Status:  Full Family Communication: None at bedside Disposition Plan: DC home when medically stable, possibly in 2-3 days.   Consultants:  None  Procedures:  None  Antibiotics:  IV Rocephin 8/29>  IV Azithromycin 8/29>   Subjective: Feels better. No further CP since last night. Dyspnea, cough and wheezing are better.   Objective: Filed Vitals:   03/01/15 2109 03/01/15 2133 03/02/15 0523 03/02/15 1411  BP: 147/85 158/90 123/63 141/77  Pulse: 111 100 91 93  Temp: 99.3 F (37.4 C) 98.3 F (36.8 C) 98.2 F (36.8 C) 98.7 F (37.1 C)  TempSrc: Oral Oral Oral Oral  Resp: 22 22 18 16   Height:  5' 6"  (1.676 m)    Weight:  138.075 kg (304 lb 6.4 oz)    SpO2: 98% 100% 96% 98%    Intake/Output Summary (Last 24 hours) at 03/02/15 1603 Last data filed at 03/02/15 1500  Gross per 24 hour  Intake 8057.5 ml  Output      0 ml  Net 8057.5 ml   Filed Weights   03/01/15 1804 03/01/15 2133  Weight: 135.172 kg (298 lb) 138.075 kg (304 lb 6.4 oz)     Exam:  General exam: Moderately built and morbidly obese female lying comfortably supine in bed. Respiratory system: Diminished breath sounds in the bases with occasional basal crackles. Occasional bilateral rhonchi but fair breath sounds. No increased work of breathing. Cardiovascular system: S1 & S2 heard, RRR. No JVD, murmurs, gallops, clicks or pedal edema. Telemetry: Sinus rhythm.  Gastrointestinal system: Abdomen is nondistended, soft and nontender. Normal bowel sounds heard. Central nervous system: Alert and oriented. No focal neurological deficits. Extremities: Symmetric 5 x 5 power.   Data Reviewed: Basic Metabolic Panel:  Recent Labs Lab 03/01/15 1821 03/01/15  2215 03/02/15 0100  NA 137 141 142  K 3.4* 3.6 3.7  CL 102 112* 110  CO2 26 24 24   GLUCOSE 233* 89 121*  BUN 12 9 8   CREATININE 1.17* 0.94 0.92  CALCIUM 9.2 7.8* 8.1*   Liver Function Tests:  Recent Labs Lab 03/01/15 1821 03/01/15 2215 03/02/15 0100    AST 38 23 27  ALT 20 17 15   ALKPHOS 83 73 71  BILITOT 0.2* 0.3 0.8  PROT 8.1 7.1 7.0  ALBUMIN 3.8 3.4* 3.3*   No results for input(s): LIPASE, AMYLASE in the last 168 hours. No results for input(s): AMMONIA in the last 168 hours. CBC:  Recent Labs Lab 03/01/15 1821 03/01/15 2215 03/02/15 0100  WBC 8.5 7.7 7.2  NEUTROABS 6.3 4.6  --   HGB 12.9 11.8* 12.0  HCT 40.2 36.5 37.5  MCV 83.8 84.3 84.3  PLT 247 187 234   Cardiac Enzymes: No results for input(s): CKTOTAL, CKMB, CKMBINDEX, TROPONINI in the last 168 hours. BNP (last 3 results) No results for input(s): PROBNP in the last 8760 hours. CBG: No results for input(s): GLUCAP in the last 168 hours.  Recent Results (from the past 240 hour(s))  Blood Culture (routine x 2)     Status: None (Preliminary result)   Collection Time: 03/01/15  6:12 PM  Result Value Ref Range Status   Specimen Description BLOOD RIGHT HAND  Final   Special Requests BOTTLES DRAWN AEROBIC AND ANAEROBIC 5CC  Final   Culture   Final    NO GROWTH < 24 HOURS Performed at [facility]    Report Status PENDING  Incomplete  Blood Culture (routine x 2)     Status: None (Preliminary result)   Collection Time: 03/01/15  6:30 PM  Result Value Ref Range Status   Specimen Description BLOOD RIGHT FOREARM  Final   Special Requests BOTTLES DRAWN AEROBIC AND ANAEROBIC 5CC EACH  Final   Culture   Final    NO GROWTH < 24 HOURS Performed at [facility]    Report Status PENDING  Incomplete  Urine culture     Status: None (Preliminary result)   Collection Time: 03/01/15  6:30 PM  Result Value Ref Range Status   Specimen Description URINE, CLEAN CATCH  Final   Special Requests NONE  Final   Culture   Final    NO GROWTH < 12 HOURS Performed at [facility]    Report Status PENDING  Incomplete          Studies: Dg Chest Port 1 View  03/01/2015   CLINICAL DATA:  One week history of nonproductive cough. Left-sided chest pain,  shortness of breath and dizziness which began yesterday. Current history of hypertension, diabetes and asthma.  EXAM: PORTABLE CHEST - 1 VIEW  COMPARISON:  07/23/2012 and earlier.  FINDINGS: Cardiac silhouette upper normal in size for technique, unchanged. Thoracic aorta mildly tortuous, unchanged. Hilar and mediastinal contours otherwise unremarkable. Streaky and patchy opacities at the right lung base. Lungs otherwise clear. No pleural effusions. Normal pulmonary vascularity. No pneumothorax.  IMPRESSION: Atelectasis and/or bronchopneumonia involving the right lung base.   Electronically Signed   By: Evangeline Dakin M.D.   On: 03/01/2015 18:24        Scheduled Meds: . amLODipine  10 mg Oral Daily  . azithromycin  500 mg Intravenous Q24H  . cefTRIAXone (ROCEPHIN)  IV  1 g Intravenous Q24H  . heparin  5,000 Units Subcutaneous 3 times per  day  . irbesartan  150 mg Oral Daily  . linagliptin  5 mg Oral q morning - 10a  . rosuvastatin  10 mg Oral QPM  . sodium chloride  3 mL Intravenous Q12H  . [START ON 03/04/2015] Vitamin D (Ergocalciferol)  50,000 Units Oral Once per day on Mon Thu   Continuous Infusions: . sodium chloride 50 mL/hr at 03/01/15 2327    Principal Problem:   Sepsis Active Problems:   CAP (community acquired pneumonia)   HTN (hypertension)   HLD (hyperlipidemia)   Diabetes mellitus without complication    Time spent: 35 minutes.    Vernell Leep, MD, FACP, FHM. [facility] Pager [phone]  If 7PM-7AM, please contact night-coverage [email or web address] Password [email or web address] 03/02/2015, 4:03 PM    LOS: 1 day

## 2015-03-03 DIAGNOSIS — A419 Sepsis, unspecified organism: Principal | ICD-10-CM

## 2015-03-03 LAB — TROPONIN I

## 2015-03-03 LAB — LEGIONELLA ANTIGEN, URINE

## 2015-03-03 LAB — EXPECTORATED SPUTUM ASSESSMENT W REFEX TO RESP CULTURE

## 2015-03-03 LAB — URINE CULTURE

## 2015-03-03 LAB — EXPECTORATED SPUTUM ASSESSMENT W GRAM STAIN, RFLX TO RESP C

## 2015-03-03 LAB — HEMOGLOBIN A1C
Hgb A1c MFr Bld: 6.4 % — ABNORMAL HIGH (ref 4.8–5.6)
Mean Plasma Glucose: 137 mg/dL

## 2015-03-03 LAB — GLUCOSE, CAPILLARY: Glucose-Capillary: 102 mg/dL — ABNORMAL HIGH (ref 65–99)

## 2015-03-03 MED ORDER — LEVOFLOXACIN 500 MG PO TABS: 500.0000 mg | ORAL_TABLET | Freq: Every day | ORAL | Status: DC

## 2015-03-03 MED ORDER — GUAIFENESIN 100 MG/5ML PO SOLN: 5.0000 mL | ORAL | Status: DC | PRN

## 2015-03-03 MED ORDER — POLYETHYLENE GLYCOL 3350 17 G PO PACK: 17.0000 g | PACK | Freq: Every day | ORAL | Status: DC

## 2015-03-03 MED ORDER — HYDROCOD POLST-CPM POLST ER 10-8 MG/5ML PO SUER: 5.0000 mL | Freq: Two times a day (BID) | ORAL | Status: DC | PRN

## 2015-03-03 NOTE — Discharge Summary (Signed)
Physician Discharge Summary  Annette Lindsey:[id number] DOB: Sep 30, 1960 DOA: 03/01/2015  PCP: Annette Peers, MD  Admit date: 03/01/2015 Discharge date: 03/03/2015  Recommendations for Outpatient Follow-up:  1. Pt will need to follow up with PCP in 2-3 weeks post discharge 2. Please obtain BMP to evaluate electrolytes and kidney function 3. Please also check CBC to evaluate Hg and Hct levels 4. Levaquin for 5 more days post discharge 5. IS, robitussin as needed recommended and discussed in detail, pt verbalized understanding  6. Will need follow-up chest x-ray in 4-6 weeks to ensure resolution of pneumonia findings, pt made aware of the recommendations    Discharge Diagnoses:  Principal Problem:   Sepsis Active Problems:   CAP (community acquired pneumonia)   HTN (hypertension)   HLD (hyperlipidemia)   Diabetes mellitus without complication  Discharge Condition: Stable  Diet recommendation: Heart healthy diet discussed in details  54 year old female patient with history of HTN, HLD, DM 2, morbid obesity, OSA-did not qualify for CPAP, asthma, presented to the [facility] on 03/01/15 with complaints of fever, nonproductive cough, dyspnea, wheezing.  In the ED, met sepsis criteria-temperature 101.7, BP 231/96 (had not been taking her meds due to illness), heart rate 139 and respiratory rate 32/m. Chest x-ray suggested right lung base bronchopneumonia. Admitted for sepsis secondary to community-acquired pneumonia.  Assessment/Plan: Sepsis - Present on admission. - Secondary to community-acquired pneumonia. - Treated per sepsis protocol with IV fluids and IV antibiotics - Blood cultures 2: Negative. Urine cultures negative to date. - wants to go home and will transition to oral Levaquin upon discharge to complete therapy   Right lung base bronchopneumonia/CAP - treated with Rocephin and azithromycin. - HIV antibody screen negative - Urine streptococcal antigen:  Negative - will transition to oral Levaquin upon discharge   Atypical chest pain - EKG: On admission sinus tachycardia at 1 38 bpm, normal axis, nonspecific ST-T wave changes without acute abnormalities and QTC 451 ms. - POC troponin 1: negative/zero - chest pain resolved and pt wants to go home   Essential hypertension - resume home medical regimen   HLD - Continue statins  Type II DM - continue home regimen   OSA - tested several years ago- no CPAP advised  Morbid Obesity    DVT prophylaxis: subcutaneous Heparin Code Status: Full Family Communication: Family at bedside  Disposition Plan: DC home    Consultants:  None  Procedures:  None  Antibiotics:  IV Rocephin 8/29> 8/31  IV Azithromycin 8/29> 8//31  Levaquin upon discharge for 5 more days   Discharge Exam: Filed Vitals:   03/03/15 0941  BP: 130/73  Pulse: 87  Temp: 97.7 F (36.5 C)  Resp: 17   Filed Vitals:   03/02/15 1411 03/02/15 2153 03/03/15 0546 03/03/15 0941  BP: 141/77 160/95 141/83 130/73  Pulse: 93 94 94 87  Temp: 98.7 F (37.1 C) 98.5 F (36.9 C) 98.2 F (36.8 C) 97.7 F (36.5 C)  TempSrc: Oral Oral Oral Oral  Resp: _0 Height:      Weight:      SpO2: 98% 98% 98% 98%    General: Pt is alert, follows commands appropriately, not in acute distress Cardiovascular: Regular rate and rhythm, S1/S2 +, no murmurs, no rubs, no gallops Respiratory: Clear to auscultation bilaterally, no wheezing, no crackles, no rhonchi Abdominal: Soft, non tender, non distended, bowel sounds +, no guarding Extremities: no edema, no cyanosis, pulses palpable bilaterally DP and PT Neuro: Grossly  nonfocal  Discharge Instructions  Discharge Instructions    Diet - low sodium heart healthy    Complete by:  As directed      Increase activity slowly    Complete by:  As directed             Medication List    STOP taking these medications        predniSONE 20 MG tablet  Commonly known  as:  DELTASONE      TAKE these medications        amLODipine-valsartan 10-160 MG per tablet  Commonly known as:  EXFORGE  Take 1 tablet by mouth every morning.     chlorpheniramine-HYDROcodone 10-8 MG/5ML Suer  Commonly known as:  TUSSIONEX  Take 5 mLs by mouth every 12 (twelve) hours as needed for cough.     guaiFENesin 100 MG/5ML Soln  Commonly known as:  ROBITUSSIN  Take 5 mLs (100 mg total) by mouth every 4 (four) hours as needed for cough or to loosen phlegm.     levofloxacin 500 MG tablet  Commonly known as:  LEVAQUIN  Take 1 tablet (500 mg total) by mouth daily.     linagliptin 5 MG Tabs tablet  Commonly known as:  TRADJENTA  Take 5 mg by mouth every morning.     rosuvastatin 10 MG tablet  Commonly known as:  CRESTOR  Take 10 mg by mouth every evening.     Vitamin D (Ergocalciferol) 50000 UNITS Caps capsule  Commonly known as:  DRISDOL  Take 50,000 Units by mouth 2 (two) times a week.            Follow-up Information    Follow up with Annette Peers, MD.   Specialty:  Family Medicine   Contact information:   [address] [place] [address] [phone]       Call Annette Ramsay, MD.   Specialty:  Internal Medicine   Why:  As needed call my cell phone [phone]   Contact information:   [address] [place] [address] [phone]        The results of significant diagnostics from this hospitalization (including imaging, microbiology, ancillary and laboratory) are listed below for reference.     Microbiology: Recent Results (from the past 240 hour(s))  Blood Culture (routine x 2)     Status: None (Preliminary result)   Collection Time: 03/01/15  6:12 PM  Result Value Ref Range Status   Specimen Description BLOOD RIGHT HAND  Final   Special Requests BOTTLES DRAWN AEROBIC AND ANAEROBIC 5CC  Final   Culture   Final    NO GROWTH < 24 HOURS Performed at [facility]    Report Status PENDING   Incomplete  Blood Culture (routine x 2)     Status: None (Preliminary result)   Collection Time: 03/01/15  6:30 PM  Result Value Ref Range Status   Specimen Description BLOOD RIGHT FOREARM  Final   Special Requests BOTTLES DRAWN AEROBIC AND ANAEROBIC 5CC EACH  Final   Culture   Final    NO GROWTH < 24 HOURS Performed at [facility]    Report Status PENDING  Incomplete  Urine culture     Status: None   Collection Time: 03/01/15  6:30 PM  Result Value Ref Range Status   Specimen Description URINE, CLEAN CATCH  Final   Special Requests NONE  Final   Culture   Final  MULTIPLE SPECIES PRESENT, SUGGEST RECOLLECTION Performed at [facility]    Report Status 03/03/2015 FINAL  Final  Culture, sputum-assessment     Status: None   Collection Time: 03/03/15  8:20 AM  Result Value Ref Range Status   Specimen Description SPUTUM  Final   Special Requests NONE  Final   Sputum evaluation   Final    THIS SPECIMEN IS ACCEPTABLE. RESPIRATORY CULTURE REPORT TO FOLLOW.   Report Status 03/03/2015 FINAL  Final     Labs: Basic Metabolic Panel:  Recent Labs Lab 03/01/15 1821 03/01/15 2215 03/02/15 0100  NA 137 141 142  K 3.4* 3.6 3.7  CL 102 112* 110  CO2 _0 GLUCOSE 233* 89 121*  BUN _1 CREATININE 1.17* 0.94 0.92  CALCIUM 9.2 7.8* 8.1*   Liver Function Tests:  Recent Labs Lab 03/01/15 1821 03/01/15 2215 03/02/15 0100  AST 38 23 27  ALT _2 ALKPHOS 83 73 71  BILITOT 0.2* 0.3 0.8  PROT 8.1 7.1 7.0  ALBUMIN 3.8 3.4* 3.3*   CBC:  Recent Labs Lab 03/01/15 1821 03/01/15 2215 03/02/15 0100  WBC 8.5 7.7 7.2  NEUTROABS 6.3 4.6  --   HGB 12.9 11.8* 12.0  HCT 40.2 36.5 37.5  MCV 83.8 84.3 84.3  PLT 247 187 234   Cardiac Enzymes:  Recent Labs Lab 03/02/15 1735 03/02/15 2220 03/03/15 0439  TROPONINI <0.03 <0.03 <0.03   CBG:  Recent Labs Lab 03/02/15 1654 03/02/15 2152 03/03/15 0726  GLUCAP 88 115* 102*     SIGNED: Time  coordinating discharge: 30 minutes  MAGICK-MYERS, ISKRA, MD  [facility] 03/03/2015, 10:14 AM Pager [phone]  If 7PM-7AM, please contact night-coverage [email or web address] Password [email or web address]

## 2015-03-03 NOTE — Discharge Instructions (Signed)

## 2015-03-05 LAB — CULTURE, RESPIRATORY W GRAM STAIN
Culture: NORMAL
Gram Stain: NONE SEEN

## 2015-03-05 LAB — CULTURE, RESPIRATORY

## 2015-03-06 LAB — CULTURE, BLOOD (ROUTINE X 2)
CULTURE: NO GROWTH
Culture: NO GROWTH

## 2015-10-26 ENCOUNTER — Encounter: Payer: Self-pay | Admitting: Neurology

## 2015-10-26 ENCOUNTER — Ambulatory Visit (INDEPENDENT_AMBULATORY_CARE_PROVIDER_SITE_OTHER): Payer: BLUE CROSS/BLUE SHIELD | Admitting: Neurology

## 2015-10-26 VITALS — BP 160/110 | HR 80 | Resp 20 | Ht 67.0 in | Wt 297.0 lb

## 2015-10-26 DIAGNOSIS — G4710 Hypersomnia, unspecified: Secondary | ICD-10-CM | POA: Diagnosis not present

## 2015-10-26 DIAGNOSIS — G4730 Sleep apnea, unspecified: Secondary | ICD-10-CM | POA: Diagnosis not present

## 2015-10-26 DIAGNOSIS — R351 Nocturia: Secondary | ICD-10-CM

## 2015-10-26 DIAGNOSIS — G479 Sleep disorder, unspecified: Secondary | ICD-10-CM | POA: Diagnosis not present

## 2015-10-26 DIAGNOSIS — R5383 Other fatigue: Secondary | ICD-10-CM | POA: Diagnosis not present

## 2015-10-26 NOTE — Progress Notes (Signed)
SLEEP MEDICINE CLINIC   Provider:  Larey Seat, M D  Referring Provider: Primary Care Physician:  Maximino Greenland, MD  Chief Complaint  Patient presents with  . New Patient (Initial Visit)    snores at night, had a sleep study with Dr. Criss Rosales more than 5 years ago, rm 70, alone    HPI:  Annette Lindsey is a 55 y.o. female , seen here as a referral from Dr. Baird Cancer for a new evaluation of possible sleep apnea.   Annette Lindsey is a 54 year old right-handed African-American female, who was hospitalized 8 months ago with sepsis and community-acquired pneumonia. At the same time her diabetes was poorly controlled during hospital stay. Her BMI is between 40 and 50. In Dr. Tye Savoy considered her hypertension poorly controlled been seen on 10-05-15. The patient also had reported that she was snoring. She presented with swelling in her legs, constipation, urinary frequency, dizziness and high cholesterol as well as snoring.  Chief complaint according to patient :  " I feel tired and sleepy in daytime", keeping busy helps to avoid napping . She wakes form her own snoring.  She gained a lot of weight in the last 3 years.   Sleep habits are as follows: The patient does not have a regular bedtime, she usually feels sleepy between 11 and 12 midnight and would go to the bedroom at that time. She usually has a TV on while trying to go to sleep-  Prior to that she will have worked on her computer. From the time that she feels ready to go to sleep to being asleep  It usually takes 15 minutes. She likes to go to sleep on her belly, sometimes on her side but overall her sleep is not restful and she is moving quite a bit. The bedroom is described as core, she lives alone. Since the TV is running its neither quiet nor dark. She sleeps on 2 pillows. About 90 minutes after going to sleep she will wake up with the urge to urinate and she has frequent nocturia throughout the night at least 3 or 4. She is usually  rising by about 5:30 AM without an alarm. She is leaving for work  at about 6 AM. She has less than 5 hours of nocturnal sleep given the frequent interruption. She works as a Nurse, adult at [employer] so her days during the week and not all the same. 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 Her work is usually finished by around 3:00 pm and when she returns home she may feel tired but she does not nap. At this time she does not have a regular exercise program but she just begun a calorie counting diet. She does not use tobacco products, she does not drink alcohol, caffeine use is positive for about 2 cups of coffee a day, and about 3 glasses of tea per week. This is iced tea, sweetened.   Sleep medical history and family sleep history: No family history.  No history of trauma to head or neck, no ENT surgeries and one previous sleep study at Home.  Hysterectomy at age 68.  Pneumonia 8 month ago.   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   Review of Systems: Out of a complete 14 system review, the patient complains of only the following symptoms, and all other reviewed systems are negative. Snoring.   Epworth score  9 , Fatigue severity score 29   , depression score 2   Social History   Social History  . Marital Status: Single    Spouse Name: N/A  . Number of Children: N/A  . Years of Education: N/A   Occupational History  . Not on file.   Social History Main Topics  . Smoking status: Never Smoker   . Smokeless tobacco: Never Used  . Alcohol Use: No  . Drug Use: No  . Sexual Activity: Not on file   Other Topics Concern  . Not on file   Social History Narrative   Drinks about 3 cups of caffeine daily.    Family History  Problem Relation Age of Onset  . Cancer Father     lung  . Heart disease Mother     Past Medical History  Diagnosis Date  . Hypertension   . Hyperlipidemia   .  Nasal congestion   . Sore throat   . Visual disturbance   . Chest pain   . Constipation   . Generalized headaches   . Bruises easily   . 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 Diabetes mellitus without complication ([place])   . Asthma   . Metabolic syndrome X   . Hypercholesteremia     Past Surgical History  Procedure Laterality Date  . Abdominal hysterectomy  1988  . Mass excision  08/18/11    right buttock    Current Outpatient Prescriptions  Medication Sig Dispense Refill  . valsartan-hydrochlorothiazide (DIOVAN-HCT) 160-25 MG tablet     . linagliptin (TRADJENTA) 5 MG TABS tablet Take 5 mg by mouth every morning. Reported on 10/26/2015    . rosuvastatin (CRESTOR) 10 MG tablet Take 10 mg by mouth every evening. Reported on 10/26/2015     No current facility-administered medications for this visit.    Allergies as of 10/26/2015 - Review Complete 10/26/2015  Allergen Reaction Noted  . Other Anaphylaxis 07/31/2011  . Shellfish-derived products Swelling 07/31/2011    Vitals: BP 160/110 mmHg  Pulse 80  Resp 20  Ht 5\' 7"  (1.702 m)  Wt 297 lb (134.718 kg)  BMI 46.51 kg/m2 Last Weight:  Wt Readings from Last 1 Encounters:  10/26/15 297 lb (134.718 kg)   [id number] mass index is 46.51 kg/(m^2).     Last Height:   Ht Readings from Last 1 Encounters:  10/26/15 5\' 7"  (1.702 m)    Physical exam:  General: The patient is awake, alert and appears not in acute distress. The patient is well groomed. Head: Normocephalic, atraumatic. Neck is supple. Mallampati 4, bruxism ,  neck circumference: 17.5 . Nasal airflow congested , TMJ click evident . Retrognathia is seen.  Cardiovascular:  Regular rate and rhythm, without  murmurs or carotid bruit, and without distended neck veins. Respiratory: Lungs are clear to auscultation. Skin:  Without evidence of edema, or rash Trunk: BMI is elevated . The patient's posture is erect.   Neurologic exam : The patient is awake and  alert, oriented to place and time.   Memory subjective described as intact.   Attention span & concentration ability appears normal.  Speech is fluent, without dysarthria, dysphonia or aphasia.  Mood and affect are appropriate.  Cranial nerves: Pupils are equal and briskly reactive to light. Funduscopic exam without eevidence of pallor or edema. Extraocular movements  in vertical and horizontal planes intact and without nystagmus. Visual fields by finger perimetry are intact. Hearing to finger rub intact.   Facial sensation intact  to fine touch.  Facial motor strength is symmetric and tongue and uvula move midline. Shoulder shrug was symmetrical.   Motor exam: Normal tone, muscle bulk and symmetric strength in all extremities. Sensory:  Fine touch, pinprick and vibration were tested in all extremities. Proprioception tested in the upper extremities was normal. Coordination: Rapid alternating movements in the fingers/hands was normal. Finger-to-nose maneuver  normal without evidence of ataxia, dysmetria or tremor. Gait and station: Patient walks without assistive device and is able unassisted to climb up to the exam table. Strength within normal limits.  Stance is stable and normal.   Deep tendon reflexes: in the  upper and lower extremities are symmetric and intact. Babinski maneuver response is downgoing.  The patient was advised of the nature of the diagnosed sleep disorder , the treatment options and risks for general a health and wellness arising from not treating the condition.  I spent more than 45 minutes of face to face time with the patient. Greater than 50% of time was spent in counseling and coordination of care. We have discussed the diagnosis and differential and I answered the patient's questions.     Assessment:  After physical and neurologic examination, review of laboratory studies,  Personal review of imaging studies, reports of other /same  Imaging studies ,  Results of  polysomnography/ neurophysiology testing and pre-existing records as far as provided in visit., my assessment is   1) morbid obesity - borders to super obesity- with high risk of OSA.  BMI and mallompati and neck size all support this   2) snoring,  Waking with a dry mouth . No headaches. - OSA  3) nocturia, frequency fragments sleep .  4) sleep deprivation, poor sleep routines , needs sleep hygiene.  5) bruxism and TMJ, these conditions are important to notice is a may limit the use of a dental device in the treatment of mild sleep apnea and snoring.    Plan:  Treatment plan and additional workup :  Please remember to try to maintain good sleep hygiene, which means: Keep a regular sleep and wake schedule, try not to exercise or have a meal within 2 hours of your bedtime, try to keep your bedroom conducive for sleep, that is, cool and dark, without light distractors such as an illuminated alarm clock, and refrain from watching TV right before sleep or in the middle of the night and do not keep the TV or radio on during the night. Also, try not to use or play on electronic devices at bedtime, such as your cell phone, tablet PC or laptop. If you like to read at bedtime on an electronic device, try to dim the background light as much as possible. Do not eat in the middle of the night.  Go to bed one hour earlier and without using the TV in the bedroom. Use a sound machine .  We will request a sleep study.    We will look for leg twitching and snoring or sleep apnea.   For chronic insomnia, you are best followed by a psychiatrist and/or sleep psychologist.   We will call you with the sleep study results and make a follow up appointment if needed.      Asencion Partridge Adolf Ormiston MD  10/26/2015   CC: Lucianne Lei, Md [place], [place] [address]

## 2015-10-26 NOTE — Patient Instructions (Signed)

## 2017-08-06 ENCOUNTER — Other Ambulatory Visit: Payer: Self-pay

## 2017-08-06 ENCOUNTER — Emergency Department (HOSPITAL_COMMUNITY): Payer: Self-pay

## 2017-08-06 ENCOUNTER — Encounter (HOSPITAL_COMMUNITY): Payer: Self-pay | Admitting: Emergency Medicine

## 2017-08-06 DIAGNOSIS — Z5321 Procedure and treatment not carried out due to patient leaving prior to being seen by health care provider: Secondary | ICD-10-CM | POA: Insufficient documentation

## 2017-08-06 DIAGNOSIS — R079 Chest pain, unspecified: Secondary | ICD-10-CM | POA: Insufficient documentation

## 2017-08-06 LAB — CBC
HEMATOCRIT: 41.9 % (ref 36.0–46.0)
Hemoglobin: 13.2 g/dL (ref 12.0–15.0)
MCH: 26.3 pg (ref 26.0–34.0)
MCHC: 31.5 g/dL (ref 30.0–36.0)
MCV: 83.6 fL (ref 78.0–100.0)
PLATELETS: 274 10*3/uL (ref 150–400)
RBC: 5.01 MIL/uL (ref 3.87–5.11)
RDW: 14.7 % (ref 11.5–15.5)
WBC: 7.2 10*3/uL (ref 4.0–10.5)

## 2017-08-06 LAB — BASIC METABOLIC PANEL
Anion gap: 12 (ref 5–15)
BUN: 13 mg/dL (ref 6–20)
CO2: 23 mmol/L (ref 22–32)
CREATININE: 0.84 mg/dL (ref 0.44–1.00)
Calcium: 9.4 mg/dL (ref 8.9–10.3)
Chloride: 103 mmol/L (ref 101–111)
GFR calc Af Amer: >60 mL/min (ref >60)
GLUCOSE: 112 mg/dL — AB (ref 65–99)
Potassium: 3.7 mmol/L (ref 3.5–5.1)
SODIUM: 138 mmol/L (ref 135–145)

## 2017-08-06 LAB — I-STAT BETA HCG BLOOD, ED (MC, WL, AP ONLY)

## 2017-08-06 LAB — I-STAT TROPONIN, ED: TROPONIN I, POC: 0.01 ng/mL (ref 0.00–0.08)

## 2017-08-06 NOTE — ED Triage Notes (Signed)
Pt brought to ED by EMS from PCP office for c/o cp and congestion for the past 10 days getting worse today, pt having 6/10 left side cp getting worse with cough 324 mg ASA given by PCP SR on monitor pt is been out of BP meds for a year, BP by EMS 224/124, HR 98, R 16 96% on RA CBG 102.

## 2017-08-07 ENCOUNTER — Emergency Department (HOSPITAL_COMMUNITY)
Admission: EM | Admit: 2017-08-07 | Discharge: 2017-08-07 | Discharge status: Against Medical Advice | Payer: Self-pay | Attending: Emergency Medicine | Admitting: Emergency Medicine

## 2019-07-02 ENCOUNTER — Telehealth: Payer: Self-pay

## 2019-07-02 NOTE — Telephone Encounter (Signed)
Pt called back and was scheduled with Dr.Hernandez.

## 2019-07-02 NOTE — Telephone Encounter (Signed)
Copied from [place] [id number]. Topic: Appointment Scheduling - Scheduling Inquiry for Clinic >> Jul 02, 2019 10:25 AM Sheran Luz wrote: Patient requesting a new patient appointment with Dr. Volanda Napoleon. Unable to schedule due to template.

## 2019-07-02 NOTE — Telephone Encounter (Signed)
Calling pt to advised that Dr.Banks is not accepting any new pt. Left message for pt to return call for the information.

## 2019-08-12 ENCOUNTER — Ambulatory Visit: Payer: Self-pay | Admitting: Internal Medicine

## 2019-11-26 ENCOUNTER — Other Ambulatory Visit: Payer: Self-pay

## 2019-11-27 ENCOUNTER — Encounter: Payer: Self-pay | Admitting: Internal Medicine

## 2019-11-27 ENCOUNTER — Ambulatory Visit (INDEPENDENT_AMBULATORY_CARE_PROVIDER_SITE_OTHER): Payer: Self-pay | Admitting: Internal Medicine

## 2019-11-27 VITALS — BP 150/90 | HR 66 | Temp 97.2°F | Ht 66.0 in | Wt 243.3 lb

## 2019-11-27 DIAGNOSIS — Z1211 Encounter for screening for malignant neoplasm of colon: Secondary | ICD-10-CM

## 2019-11-27 DIAGNOSIS — Z1231 Encounter for screening mammogram for malignant neoplasm of breast: Secondary | ICD-10-CM

## 2019-11-27 DIAGNOSIS — I10 Essential (primary) hypertension: Secondary | ICD-10-CM

## 2019-11-27 DIAGNOSIS — E119 Type 2 diabetes mellitus without complications: Secondary | ICD-10-CM

## 2019-11-27 LAB — POCT GLYCOSYLATED HEMOGLOBIN (HGB A1C): Hemoglobin A1C: 5.6 % (ref 4.0–5.6)

## 2019-11-27 MED ORDER — LISINOPRIL 20 MG PO TABS: 20.0000 mg | ORAL_TABLET | Freq: Every day | ORAL | 1 refills | Status: DC

## 2019-11-27 NOTE — Patient Instructions (Signed)
-Nice seeing you today!!  -Start lisinopril 20 mg daily.  -Schedule follow up in 6 weeks for Blood pressure check. Come in fasting that day to run lab work.  -Low salt diet (see below).   DASH Eating Plan DASH stands for "Dietary Approaches to Stop Hypertension." The DASH eating plan is a healthy eating plan that has been shown to reduce high blood pressure (hypertension). It may also reduce your risk for type 2 diabetes, heart disease, and stroke. The DASH eating plan may also help with weight loss. What are tips for following this plan?  General guidelines  Avoid eating more than 2,300 mg (milligrams) of salt (sodium) a day. If you have hypertension, you may need to reduce your sodium intake to 1,500 mg a day.  Limit alcohol intake to no more than 1 drink a day for nonpregnant women and 2 drinks a day for men. One drink equals 12 oz of beer, 5 oz of wine, or 1 oz of hard liquor.  Work with your health care provider to maintain a healthy body weight or to lose weight. Ask what an ideal weight is for you.  Get at least 30 minutes of exercise that causes your heart to beat faster (aerobic exercise) most days of the week. Activities may include walking, swimming, or biking.  Work with your health care provider or diet and nutrition specialist (dietitian) to adjust your eating plan to your individual calorie needs. Reading food labels   Check food labels for the amount of sodium per serving. Choose foods with less than 5 percent of the Daily Value of sodium. Generally, foods with less than 300 mg of sodium per serving fit into this eating plan.  To find whole grains, look for the word "whole" as the first word in the ingredient list. Shopping  Buy products labeled as "low-sodium" or "no salt added."  Buy fresh foods. Avoid canned foods and premade or frozen meals. Cooking  Avoid adding salt when cooking. Use salt-free seasonings or herbs instead of table salt or sea salt. Check  with your health care provider or pharmacist before using salt substitutes.  Do not fry foods. Cook foods using healthy methods such as baking, boiling, grilling, and broiling instead.  Cook with heart-healthy oils, such as olive, canola, soybean, or sunflower oil. Meal planning  Eat a balanced diet that includes: ? 5 or more servings of fruits and vegetables each day. At each meal, try to fill half of your plate with fruits and vegetables. ? Up to 6-8 servings of whole grains each day. ? Less than 6 oz of lean meat, poultry, or fish each day. A 3-oz serving of meat is about the same size as a deck of cards. One egg equals 1 oz. ? 2 servings of low-fat dairy each day. ? A serving of nuts, seeds, or beans 5 times each week. ? Heart-healthy fats. Healthy fats called Omega-3 fatty acids are found in foods such as flaxseeds and coldwater fish, like sardines, salmon, and mackerel.  Limit how much you eat of the following: ? Canned or prepackaged foods. ? Food that is high in trans fat, such as fried foods. ? Food that is high in saturated fat, such as fatty meat. ? Sweets, desserts, sugary drinks, and other foods with added sugar. ? Full-fat dairy products.  Do not salt foods before eating.  Try to eat at least 2 vegetarian meals each week.  Eat more home-cooked food and less restaurant, buffet, and fast food.  When eating at a restaurant, ask that your food be prepared with less salt or no salt, if possible. What foods are recommended? The items listed may not be a complete list. Talk with your dietitian about what dietary choices are best for you. Grains Whole-grain or whole-wheat bread. Whole-grain or whole-wheat pasta. Brown rice. [place] Morrow. Bulgur. Whole-grain and low-sodium cereals. Pita bread. Low-fat, low-sodium crackers. Whole-wheat flour tortillas. Vegetables Fresh or frozen vegetables (raw, steamed, roasted, or grilled). Low-sodium or reduced-sodium tomato and vegetable  juice. Low-sodium or reduced-sodium tomato sauce and tomato paste. Low-sodium or reduced-sodium canned vegetables. Fruits All fresh, dried, or frozen fruit. Canned fruit in natural juice (without added sugar). Meat and other protein foods Skinless chicken or [place]. Ground chicken or [place]. Pork with fat trimmed off. Fish and seafood. Egg whites. Dried beans, peas, or lentils. Unsalted nuts, nut butters, and seeds. Unsalted canned beans. Lean cuts of beef with fat trimmed off. Low-sodium, lean deli meat. Dairy Low-fat (1%) or fat-free (skim) milk. Fat-free, low-fat, or reduced-fat cheeses. Nonfat, low-sodium ricotta or cottage cheese. Low-fat or nonfat yogurt. Low-fat, low-sodium cheese. Fats and oils Soft margarine without trans fats. Vegetable oil. Low-fat, reduced-fat, or light mayonnaise and salad dressings (reduced-sodium). Canola, safflower, olive, soybean, and sunflower oils. Avocado. Seasoning and other foods Herbs. Spices. Seasoning mixes without salt. Unsalted popcorn and pretzels. Fat-free sweets. What foods are not recommended? The items listed may not be a complete list. Talk with your dietitian about what dietary choices are best for you. Grains Baked goods made with fat, such as croissants, muffins, or some breads. Dry pasta or rice meal packs. Vegetables Creamed or fried vegetables. Vegetables in a cheese sauce. Regular canned vegetables (not low-sodium or reduced-sodium). Regular canned tomato sauce and paste (not low-sodium or reduced-sodium). Regular tomato and vegetable juice (not low-sodium or reduced-sodium). Angie Fava. Olives. Fruits Canned fruit in a light or heavy syrup. Fried fruit. Fruit in cream or butter sauce. Meat and other protein foods Fatty cuts of meat. Ribs. Fried meat. Berniece Salines. Sausage. Bologna and other processed lunch meats. Salami. Fatback. Hotdogs. Bratwurst. Salted nuts and seeds. Canned beans with added salt. Canned or smoked fish. Whole eggs or egg yolks.  Chicken or [place] with skin. Dairy Whole or 2% milk, cream, and half-and-half. Whole or full-fat cream cheese. Whole-fat or sweetened yogurt. Full-fat cheese. Nondairy creamers. Whipped toppings. Processed cheese and cheese spreads. Fats and oils Butter. Stick margarine. Lard. Shortening. Ghee. Bacon fat. Tropical oils, such as coconut, palm kernel, or palm oil. Seasoning and other foods Salted popcorn and pretzels. Onion salt, garlic salt, seasoned salt, table salt, and sea salt. Worcestershire sauce. Tartar sauce. Barbecue sauce. Teriyaki sauce. Soy sauce, including reduced-sodium. Steak sauce. Canned and packaged gravies. Fish sauce. Oyster sauce. Cocktail sauce. Horseradish that you find on the shelf. Ketchup. Mustard. Meat flavorings and tenderizers. Bouillon cubes. Hot sauce and Tabasco sauce. Premade or packaged marinades. Premade or packaged taco seasonings. Relishes. Regular salad dressings. Where to find more information:  National Heart, Lung, and [place]: https://wilson-eaton.com/  American Heart Association: www.heart.org Summary  The DASH eating plan is a healthy eating plan that has been shown to reduce high blood pressure (hypertension). It may also reduce your risk for type 2 diabetes, heart disease, and stroke.  With the DASH eating plan, you should limit salt (sodium) intake to 2,300 mg a day. If you have hypertension, you may need to reduce your sodium intake to 1,500 mg a day.  When on the DASH eating plan,  aim to eat more fresh fruits and vegetables, whole grains, lean proteins, low-fat dairy, and heart-healthy fats.  Work with your health care provider or diet and nutrition specialist (dietitian) to adjust your eating plan to your individual calorie needs. This information is not intended to replace advice given to you by your health care provider. Make sure you discuss any questions you have with your health care provider. Document Revised: 06/01/2017 Document Reviewed:  06/12/2016 Elsevier Patient Education  2020 [employer].

## 2019-11-27 NOTE — Progress Notes (Signed)
New Patient Office Visit     This visit occurred during the SARS-CoV-2 public health emergency.  Safety protocols were in place, including screening questions prior to the visit, additional usage of staff PPE, and extensive cleaning of exam room while observing appropriate contact time as indicated for disinfecting solutions.    CC/Reason for Visit: Establish care, discuss chronic conditions Previous PCP: Unknown Last Visit: Unknown  HPI: Annette Lindsey is a 59 y.o. female who is coming in today for the above mentioned reasons. Past Medical History is significant for: Hypertension, morbid obesity and type 2 diabetes that has not been on medication for years.  Through diet and exercise she has been able to lose 70 pounds over the past year.  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  She has 1 daughter and 1 grandchild.  She has no known drug allergies, she does not smoke, she does not drink, her past surgical history is significant for hysterectomy at age 22 due to fibroids and excessive bleeding requiring transfusions, family history significant for mother with coronary artery disease and diabetes, sister with diabetes.  She has noticed that her blood pressure has been high and this is the main reason for her visit today.  She has no acute complaints, she has had both Covid vaccines.  She is overdue for colonoscopy and mammogram.  She was told by her GYN that she no longer needed Pap smears after her hysterectomy.   Past Medical/Surgical History: Past Medical History:  Diagnosis Date  . Asthma   . 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 Bruises easily   . Chest pain   . Constipation   . Diabetes mellitus without complication ([place])   . Generalized headaches   . Hypercholesteremia   . Hyperlipidemia   . Hypertension   . Metabolic syndrome X   . Nasal congestion   . Sore throat   . Visual disturbance     Past  Surgical History:  Procedure Laterality Date  . ABDOMINAL HYSTERECTOMY  1988  . MASS EXCISION  08/18/11   right buttock    Social History:  reports that she has never smoked. She has never used smokeless tobacco. She reports that she does not drink alcohol or use drugs.  Allergies: Allergies  Allergen Reactions  . Other Anaphylaxis    Patient has this reaction to grass. 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 Shellfish-Derived Products Swelling    Of throat    Family History:  Family History  Problem Relation Age of Onset  . Cancer Father        lung  . Heart disease Mother      Current Outpatient Medications:  .  lisinopril (ZESTRIL) 20 MG tablet, Take 1 tablet (20 mg total) by mouth daily., Disp: 90 tablet, Rfl: 1  Review of Systems:  Constitutional: Denies fever, chills, diaphoresis, appetite change and fatigue.  HEENT: Denies photophobia, eye pain, redness, hearing loss, ear pain, congestion, sore throat, rhinorrhea, sneezing, mouth sores, trouble swallowing, neck pain, neck stiffness and tinnitus.   Respiratory: Denies SOB, DOE, cough, chest tightness,  and wheezing.   Cardiovascular: Denies chest pain, palpitations and leg swelling.  Gastrointestinal: Denies nausea, vomiting, abdominal pain, diarrhea, constipation, blood in stool and abdominal distention.  Genitourinary: Denies dysuria, urgency, frequency, hematuria, flank pain and difficulty urinating.  Endocrine: Denies: hot or cold intolerance, sweats, changes in hair  or nails, polyuria, polydipsia. Musculoskeletal: Denies myalgias, back pain, joint swelling, arthralgias and gait problem.  Skin: Denies pallor, rash and wound.  Neurological: Denies dizziness, seizures, syncope, weakness, light-headedness, numbness and headaches.  Hematological: Denies adenopathy. Easy bruising, personal or family bleeding history  Psychiatric/Behavioral: Denies suicidal ideation, mood  changes, confusion, nervousness, sleep disturbance and agitation    Physical Exam: Vitals:   11/27/19 0810  BP: (!) 150/90  Pulse: 66  Temp: (!) 97.2 F (36.2 C)  TempSrc: Temporal  SpO2: 99%  Weight: 243 lb 4.8 oz (110.4 kg)  Height: 5\' 6"  (1.676 m)   Body mass index is 39.27 kg/m.  Constitutional: NAD, calm, comfortable Eyes: PERRL, lids and conjunctivae normal ENMT: Mucous membranes are moist.  Respiratory: clear to auscultation bilaterally, no wheezing, no crackles. Normal respiratory effort. No accessory muscle use.  Cardiovascular: Regular rate and rhythm, no murmurs / rubs / gallops. No extremity edema.  Neurologic: Grossly intact and nonfocal Psychiatric: Normal judgment and insight. Alert and oriented x 3. Normal mood.    Impression and Plan:  Diabetes mellitus without complication (HCC)  -123456 (today is 5.6 demonstrating excellent control, continue to observe off medication, continue weight loss and healthy eating.  Screening mammogram, encounter for  - Plan: MM Digital Screening  Screening for malignant neoplasm of colon  - Plan: Ambulatory referral to Gastroenterology  Essential hypertension  -Uncontrolled today with a blood pressure 150/90. -Start lisinopril 20 mg, return in 6 weeks for follow-up.  Morbid obesity due to excess calories ([place]) -Discussed healthy lifestyle, including increased physical activity and better food choices to promote weight loss. -She has been congratulated on her weight loss efforts thus far.    Patient Instructions  -Nice seeing you today!!  -Start lisinopril 20 mg daily.  -Schedule follow up in 6 weeks for Blood pressure check. Come in fasting that day to run lab work.  -Low salt diet (see below).   DASH Eating Plan DASH stands for "Dietary Approaches to Stop Hypertension." The DASH eating plan is a healthy eating plan that has been shown to reduce high blood pressure (hypertension). It may also reduce your risk for  type 2 diabetes, heart disease, and stroke. The DASH eating plan may also help with weight loss. What are tips for following this plan?  General guidelines  Avoid eating more than 2,300 mg (milligrams) of salt (sodium) a day. If you have hypertension, you may need to reduce your sodium intake to 1,500 mg a day.  Limit alcohol intake to no more than 1 drink a day for nonpregnant women and 2 drinks a day for men. One drink equals 12 oz of beer, 5 oz of wine, or 1 oz of hard liquor.  Work with your health care provider to maintain a healthy body weight or to lose weight. Ask what an ideal weight is for you.  Get at least 30 minutes of exercise that causes your heart to beat faster (aerobic exercise) most days of the week. Activities may include walking, swimming, or biking.  Work with your health care provider or diet and nutrition specialist (dietitian) to adjust your eating plan to your individual calorie needs. Reading food labels   Check food labels for the amount of sodium per serving. Choose foods with less than 5 percent of the Daily Value of sodium. Generally, foods with less than 300 mg of sodium per serving fit into this eating plan.  To find whole grains, look for the word "whole" as the first word  in the ingredient list. Shopping  Buy products labeled as "low-sodium" or "no salt added."  Buy fresh foods. Avoid canned foods and premade or frozen meals. Cooking  Avoid adding salt when cooking. Use salt-free seasonings or herbs instead of table salt or sea salt. Check with your health care provider or pharmacist before using salt substitutes.  Do not fry foods. Cook foods using healthy methods such as baking, boiling, grilling, and broiling instead.  Cook with heart-healthy oils, such as olive, canola, soybean, or sunflower oil. Meal planning  Eat a balanced diet that includes: ? 5 or more servings of fruits and vegetables each day. At each meal, try to fill half of your  plate with fruits and vegetables. ? Up to 6-8 servings of whole grains each day. ? Less than 6 oz of lean meat, poultry, or fish each day. A 3-oz serving of meat is about the same size as a deck of cards. One egg equals 1 oz. ? 2 servings of low-fat dairy each day. ? A serving of nuts, seeds, or beans 5 times each week. ? Heart-healthy fats. Healthy fats called Omega-3 fatty acids are found in foods such as flaxseeds and coldwater fish, like sardines, salmon, and mackerel.  Limit how much you eat of the following: ? Canned or prepackaged foods. ? Food that is high in trans fat, such as fried foods. ? Food that is high in saturated fat, such as fatty meat. ? Sweets, desserts, sugary drinks, and other foods with added sugar. ? Full-fat dairy products.  Do not salt foods before eating.  Try to eat at least 2 vegetarian meals each week.  Eat more home-cooked food and less restaurant, buffet, and fast food.  When eating at a restaurant, ask that your food be prepared with less salt or no salt, if possible. What foods are recommended? The items listed may not be a complete list. Talk with your dietitian about what dietary choices are best for you. Grains Whole-grain or whole-wheat bread. Whole-grain or whole-wheat pasta. Brown rice. [place]. Bulgur. Whole-grain and low-sodium cereals. Pita bread. Low-fat, low-sodium crackers. Whole-wheat flour tortillas. Vegetables Fresh or frozen vegetables (raw, steamed, roasted, or grilled). Low-sodium or reduced-sodium tomato and vegetable juice. Low-sodium or reduced-sodium tomato sauce and tomato paste. Low-sodium or reduced-sodium canned vegetables. Fruits All fresh, dried, or frozen fruit. Canned fruit in natural juice (without added sugar). Meat and other protein foods Skinless chicken or [place]. Ground chicken or [place]. Pork with fat trimmed off. Fish and seafood. Egg whites. Dried beans, peas, or lentils. Unsalted nuts, nut butters, and  seeds. Unsalted canned beans. Lean cuts of beef with fat trimmed off. Low-sodium, lean deli meat. Dairy Low-fat (1%) or fat-free (skim) milk. Fat-free, low-fat, or reduced-fat cheeses. Nonfat, low-sodium ricotta or cottage cheese. Low-fat or nonfat yogurt. Low-fat, low-sodium cheese. Fats and oils Soft margarine without trans fats. Vegetable oil. Low-fat, reduced-fat, or light mayonnaise and salad dressings (reduced-sodium). Canola, safflower, olive, soybean, and sunflower oils. Avocado. Seasoning and other foods Herbs. Spices. Seasoning mixes without salt. Unsalted popcorn and pretzels. Fat-free sweets. What foods are not recommended? The items listed may not be a complete list. Talk with your dietitian about what dietary choices are best for you. Grains Baked goods made with fat, such as croissants, muffins, or some breads. Dry pasta or rice meal packs. Vegetables Creamed or fried vegetables. Vegetables in a cheese sauce. Regular canned vegetables (not low-sodium or reduced-sodium). Regular canned tomato sauce and paste (not low-sodium or reduced-sodium).  Regular tomato and vegetable juice (not low-sodium or reduced-sodium). Angie Fava. Olives. Fruits Canned fruit in a light or heavy syrup. Fried fruit. Fruit in cream or butter sauce. Meat and other protein foods Fatty cuts of meat. Ribs. Fried meat. Berniece Salines. Sausage. Bologna and other processed lunch meats. Salami. Fatback. Hotdogs. Bratwurst. Salted nuts and seeds. Canned beans with added salt. Canned or smoked fish. Whole eggs or egg yolks. Chicken or [place] with skin. Dairy Whole or 2% milk, cream, and half-and-half. Whole or full-fat cream cheese. Whole-fat or sweetened yogurt. Full-fat cheese. Nondairy creamers. Whipped toppings. Processed cheese and cheese spreads. Fats and oils Butter. Stick margarine. Lard. Shortening. Ghee. Bacon fat. Tropical oils, such as coconut, palm kernel, or palm oil. Seasoning and other foods Salted popcorn and  pretzels. Onion salt, garlic salt, seasoned salt, table salt, and sea salt. Worcestershire sauce. Tartar sauce. Barbecue sauce. Teriyaki sauce. Soy sauce, including reduced-sodium. Steak sauce. Canned and packaged gravies. Fish sauce. Oyster sauce. Cocktail sauce. Horseradish that you find on the shelf. Ketchup. Mustard. Meat flavorings and tenderizers. Bouillon cubes. Hot sauce and Tabasco sauce. Premade or packaged marinades. Premade or packaged taco seasonings. Relishes. Regular salad dressings. Where to find more information:  National Heart, Lung, and [place]: https://wilson-eaton.com/  American Heart Association: www.heart.org Summary  The DASH eating plan is a healthy eating plan that has been shown to reduce high blood pressure (hypertension). It may also reduce your risk for type 2 diabetes, heart disease, and stroke.  With the DASH eating plan, you should limit salt (sodium) intake to 2,300 mg a day. If you have hypertension, you may need to reduce your sodium intake to 1,500 mg a day.  When on the DASH eating plan, aim to eat more fresh fruits and vegetables, whole grains, lean proteins, low-fat dairy, and heart-healthy fats.  Work with your health care provider or diet and nutrition specialist (dietitian) to adjust your eating plan to your individual calorie needs. This information is not intended to replace advice given to you by your health care provider. Make sure you discuss any questions you have with your health care provider. Document Revised: 06/01/2017 Document Reviewed: 06/12/2016 Elsevier Patient Education  2020 [place], [place] [facility]

## 2019-12-09 ENCOUNTER — Telehealth: Payer: Self-pay | Admitting: *Deleted

## 2019-12-09 NOTE — Telephone Encounter (Signed)
Patient spoke with nurse triage on 12/08/2019 at 0929. Patient reports she was prescribed lisinopril for blood pressure. Her blood pressure is still high. Patient reports her blood pressure 213/102 currently and it has been running in the 200s for over. She started lisinopril a little over a week ago. Patient was advised to to ED.   Clinic RN does not see where patient followed through with advice. Please advise

## 2019-12-09 NOTE — Telephone Encounter (Signed)
If high BP with CP, SOB, dizziness or focal neurologic deficit needs ED visit. Otherwise ok to monitor at home and continue lisinopril. She should have a 6 week f/u visit. If not, let's schedule.

## 2019-12-09 NOTE — Telephone Encounter (Signed)
Spoke with patient. Patient denied chest pain, shortness of breath, dizziness and neuro deficits. Patient reports she feels as though the Lisinopril is not bringing her blood pressure down and wants to try something else. Informed patient that she needs to continue on the lisinopril and monitor BP readings at home and scheduled her a visit for 01/08/2020 at 3:30 PM.

## 2020-01-08 ENCOUNTER — Encounter: Payer: Self-pay | Admitting: Internal Medicine

## 2020-01-08 ENCOUNTER — Ambulatory Visit: Payer: Self-pay | Admitting: Internal Medicine

## 2020-01-08 ENCOUNTER — Other Ambulatory Visit: Payer: Self-pay

## 2020-01-08 VITALS — BP 180/90 | HR 64 | Temp 98.3°F | Wt 245.4 lb

## 2020-01-08 DIAGNOSIS — I10 Essential (primary) hypertension: Secondary | ICD-10-CM

## 2020-01-08 DIAGNOSIS — E119 Type 2 diabetes mellitus without complications: Secondary | ICD-10-CM

## 2020-01-08 DIAGNOSIS — R011 Cardiac murmur, unspecified: Secondary | ICD-10-CM

## 2020-01-08 DIAGNOSIS — H538 Other visual disturbances: Secondary | ICD-10-CM

## 2020-01-08 DIAGNOSIS — E785 Hyperlipidemia, unspecified: Secondary | ICD-10-CM

## 2020-01-08 MED ORDER — LISINOPRIL 40 MG PO TABS: 40.0000 mg | ORAL_TABLET | Freq: Every day | ORAL | 1 refills | Status: DC

## 2020-01-08 MED ORDER — HYDROCHLOROTHIAZIDE 25 MG PO TABS: 25.0000 mg | ORAL_TABLET | Freq: Every day | ORAL | 1 refills | Status: DC

## 2020-01-08 NOTE — Progress Notes (Signed)
Established Patient Office Visit     This visit occurred during the SARS-CoV-2 public health emergency.  Safety protocols were in place, including screening questions prior to the visit, additional usage of staff PPE, and extensive cleaning of exam room while observing appropriate contact time as indicated for disinfecting solutions.    CC/Reason for Visit: Blood pressure follow-up  HPI: Annette Lindsey is a 59 y.o. female who is coming in today for the above mentioned reasons. Past Medical History is significant for: Well-controlled type 2 diabetes, uncontrolled hypertension, morbid obesity.  I saw her for the first time 6 weeks ago with very uncontrolled blood pressure and she was started on lisinopril 20 mg.  She is here today for follow-up.  Her blood pressure remains high in office today at 180/110, 180/98, this is concordant with her ambulatory blood pressure measurements.  She has been having some headaches and some blurry vision but denies chest pain, shortness of breath or focal neurologic deficit.  She has not been consistent with a low-sodium diet.   Past Medical/Surgical History: Past Medical History:  Diagnosis Date  . Asthma   . 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 Bruises easily   . Chest pain   . Constipation   . Diabetes mellitus without complication ([place])   . Generalized headaches   . Hypercholesteremia   . Hyperlipidemia   . Hypertension   . Metabolic syndrome X   . Nasal congestion   . Sore throat   . Visual disturbance     Past Surgical History:  Procedure Laterality Date  . ABDOMINAL HYSTERECTOMY  1988  . MASS EXCISION  08/18/11   right buttock    Social History:  reports that she has never smoked. She has never used smokeless tobacco. She reports that she does not drink alcohol and does not use drugs.  Allergies: Allergies  Allergen Reactions  . Other Anaphylaxis    Patient has this reaction to grass. Patient also has  asthma reaction to smoke (when in closed spaces - cooking, etc.)  . Shellfish-Derived Products Swelling    Of throat    Family History:  Family History  Problem Relation Age of Onset  . Cancer Father        lung  . Heart disease Mother      Current Outpatient Medications:  .  hydrochlorothiazide (HYDRODIURIL) 25 MG tablet, Take 1 tablet (25 mg total) by mouth daily., Disp: 90 tablet, Rfl: 1 .  lisinopril (ZESTRIL) 40 MG tablet, Take 1 tablet (40 mg total) by mouth daily., Disp: 90 tablet, Rfl: 1  Review of Systems:  Constitutional: Denies fever, chills, diaphoresis, appetite change and fatigue.  HEENT: Denies photophobia, eye pain, redness, hearing loss, ear pain, congestion, sore throat, rhinorrhea, sneezing, mouth sores, trouble swallowing, neck pain, neck stiffness and tinnitus.   Respiratory: Denies SOB, DOE, cough, chest tightness,  and wheezing.   Cardiovascular: Denies chest pain, palpitations and leg swelling.  Gastrointestinal: Denies nausea, vomiting, abdominal pain, diarrhea, constipation, blood in stool and abdominal distention.  Genitourinary: Denies dysuria, urgency, frequency, hematuria, flank pain and difficulty urinating.  Endocrine: Denies: hot or cold intolerance, sweats, changes in hair or nails, polyuria, polydipsia. Musculoskeletal: Denies myalgias, back pain, joint swelling, arthralgias and gait problem.  Skin: Denies pallor, rash and wound.  Neurological: Denies dizziness, seizures, syncope, weakness, light-headedness, numbness. Hematological: Denies adenopathy. Easy bruising, personal or family bleeding history  Psychiatric/Behavioral: Denies suicidal ideation, mood changes, confusion, nervousness, sleep disturbance and agitation  Physical Exam: Vitals:   01/08/20 1534 01/08/20 1557  BP: (!) 180/110 (!) 180/90  Pulse: 64   Temp: 98.3 F (36.8 C)   TempSrc: Temporal   SpO2: 97%   Weight: 245 lb 6.4 oz (111.3 kg)     Body mass index is 39.61  kg/m.   Constitutional: NAD, calm, comfortable, obese Eyes: PERRL, lids and conjunctivae normal ENMT: Mucous membranes are moist.  Respiratory: clear to auscultation bilaterally, no wheezing, no crackles. Normal respiratory effort. No accessory muscle use.  Cardiovascular: Regular rate and rhythm, positive systolic murmur, no rubs / gallops. No extremity edema.  Neurologic: Grossly intact and nonfocal Psychiatric: Normal judgment and insight. Alert and oriented x 3. Normal mood.    Impression and Plan:  Diabetes mellitus without complication ([place]) -Well-controlled with a recent A1c of 5.6.  Essential hypertension -Very uncontrolled, increase lisinopril to 40 mg and add hydrochlorothiazide 25 mg. -Return in 6 weeks for repeat blood pressure and basic metabolic profile to follow renal function and electrolytes. -Discussed low-sodium diet.  Morbid obesity ([place]) -Discussed healthy lifestyle, including increased physical activity and better food choices to promote weight loss.  Hyperlipidemia, unspecified hyperlipidemia type -Check lipids when she returns for physical.  Blurry vision, bilateral  - Plan: Ambulatory referral to Ophthalmology  Systolic murmur -Send for 2D echocardiogram.   Patient Instructions  -Nice seeing you today!!  -Increase lieinopril to 40 mg daily.  -Start HCTZ 25 mg daily.  -Low salt diet (see below).  -Schedule follow up in 6 weeks.   DASH Eating Plan DASH stands for "Dietary Approaches to Stop Hypertension." The DASH eating plan is a healthy eating plan that has been shown to reduce high blood pressure (hypertension). It may also reduce your risk for type 2 diabetes, heart disease, and stroke. The DASH eating plan may also help with weight loss. What are tips for following this plan?  General guidelines  Avoid eating more than 2,300 mg (milligrams) of salt (sodium) a day. If you have hypertension, you may need to reduce your sodium intake to  1,500 mg a day.  Limit alcohol intake to no more than 1 drink a day for nonpregnant women and 2 drinks a day for men. One drink equals 12 oz of beer, 5 oz of wine, or 1 oz of hard liquor.  Work with your health care provider to maintain a healthy body weight or to lose weight. Ask what an ideal weight is for you.  Get at least 30 minutes of exercise that causes your heart to beat faster (aerobic exercise) most days of the week. Activities may include walking, swimming, or biking.  Work with your health care provider or diet and nutrition specialist (dietitian) to adjust your eating plan to your individual calorie needs. Reading food labels   Check food labels for the amount of sodium per serving. Choose foods with less than 5 percent of the Daily Value of sodium. Generally, foods with less than 300 mg of sodium per serving fit into this eating plan.  To find whole grains, look for the word "whole" as the first word in the ingredient list. Shopping  Buy products labeled as "low-sodium" or "no salt added."  Buy fresh foods. Avoid canned foods and premade or frozen meals. Cooking  Avoid adding salt when cooking. Use salt-free seasonings or herbs instead of table salt or sea salt. Check with your health care provider or pharmacist before using salt substitutes.  Do not fry foods. Cook foods using healthy methods  such as baking, boiling, grilling, and broiling instead.  Cook with heart-healthy oils, such as olive, canola, soybean, or sunflower oil. Meal planning  Eat a balanced diet that includes: ? 5 or more servings of fruits and vegetables each day. At each meal, try to fill half of your plate with fruits and vegetables. ? Up to 6-8 servings of whole grains each day. ? Less than 6 oz of lean meat, poultry, or fish each day. A 3-oz serving of meat is about the same size as a deck of cards. One egg equals 1 oz. ? 2 servings of low-fat dairy each day. ? A serving of nuts, seeds, or  beans 5 times each week. ? Heart-healthy fats. Healthy fats called Omega-3 fatty acids are found in foods such as flaxseeds and coldwater fish, like sardines, salmon, and mackerel.  Limit how much you eat of the following: ? Canned or prepackaged foods. ? Food that is high in trans fat, such as fried foods. ? Food that is high in saturated fat, such as fatty meat. ? Sweets, desserts, sugary drinks, and other foods with added sugar. ? Full-fat dairy products.  Do not salt foods before eating.  Try to eat at least 2 vegetarian meals each week.  Eat more home-cooked food and less restaurant, buffet, and fast food.  When eating at a restaurant, ask that your food be prepared with less salt or no salt, if possible. What foods are recommended? The items listed may not be a complete list. Talk with your dietitian about what dietary choices are best for you. Grains Whole-grain or whole-wheat bread. Whole-grain or whole-wheat pasta. Brown rice. [place]. Bulgur. Whole-grain and low-sodium cereals. Pita bread. Low-fat, low-sodium crackers. Whole-wheat flour tortillas. Vegetables Fresh or frozen vegetables (raw, steamed, roasted, or grilled). Low-sodium or reduced-sodium tomato and vegetable juice. Low-sodium or reduced-sodium tomato sauce and tomato paste. Low-sodium or reduced-sodium canned vegetables. Fruits All fresh, dried, or frozen fruit. Canned fruit in natural juice (without added sugar). Meat and other protein foods Skinless chicken or [place]. Ground chicken or [place]. Pork with fat trimmed off. Fish and seafood. Egg whites. Dried beans, peas, or lentils. Unsalted nuts, nut butters, and seeds. Unsalted canned beans. Lean cuts of beef with fat trimmed off. Low-sodium, lean deli meat. Dairy Low-fat (1%) or fat-free (skim) milk. Fat-free, low-fat, or reduced-fat cheeses. Nonfat, low-sodium ricotta or cottage cheese. Low-fat or nonfat yogurt. Low-fat, low-sodium cheese. Fats and  oils Soft margarine without trans fats. Vegetable oil. Low-fat, reduced-fat, or light mayonnaise and salad dressings (reduced-sodium). Canola, safflower, olive, soybean, and sunflower oils. Avocado. Seasoning and other foods Herbs. Spices. Seasoning mixes without salt. Unsalted popcorn and pretzels. Fat-free sweets. What foods are not recommended? The items listed may not be a complete list. Talk with your dietitian about what dietary choices are best for you. Grains Baked goods made with fat, such as croissants, muffins, or some breads. Dry pasta or rice meal packs. Vegetables Creamed or fried vegetables. Vegetables in a cheese sauce. Regular canned vegetables (not low-sodium or reduced-sodium). Regular canned tomato sauce and paste (not low-sodium or reduced-sodium). Regular tomato and vegetable juice (not low-sodium or reduced-sodium). Angie Fava. Olives. Fruits Canned fruit in a light or heavy syrup. Fried fruit. Fruit in cream or butter sauce. Meat and other protein foods Fatty cuts of meat. Ribs. Fried meat. Berniece Salines. Sausage. Bologna and other processed lunch meats. Salami. Fatback. Hotdogs. Bratwurst. Salted nuts and seeds. Canned beans with added salt. Canned or smoked fish. Whole eggs or  egg yolks. Chicken or [place] with skin. Dairy Whole or 2% milk, cream, and half-and-half. Whole or full-fat cream cheese. Whole-fat or sweetened yogurt. Full-fat cheese. Nondairy creamers. Whipped toppings. Processed cheese and cheese spreads. Fats and oils Butter. Stick margarine. Lard. Shortening. Ghee. Bacon fat. Tropical oils, such as coconut, palm kernel, or palm oil. Seasoning and other foods Salted popcorn and pretzels. Onion salt, garlic salt, seasoned salt, table salt, and sea salt. Worcestershire sauce. Tartar sauce. Barbecue sauce. Teriyaki sauce. Soy sauce, including reduced-sodium. Steak sauce. Canned and packaged gravies. Fish sauce. Oyster sauce. Cocktail sauce. Horseradish that you find on the  shelf. Ketchup. Mustard. Meat flavorings and tenderizers. Bouillon cubes. Hot sauce and Tabasco sauce. Premade or packaged marinades. Premade or packaged taco seasonings. Relishes. Regular salad dressings. Where to find more information:  National Heart, Lung, and [place]: https://wilson-eaton.com/  American Heart Association: www.heart.org Summary  The DASH eating plan is a healthy eating plan that has been shown to reduce high blood pressure (hypertension). It may also reduce your risk for type 2 diabetes, heart disease, and stroke.  With the DASH eating plan, you should limit salt (sodium) intake to 2,300 mg a day. If you have hypertension, you may need to reduce your sodium intake to 1,500 mg a day.  When on the DASH eating plan, aim to eat more fresh fruits and vegetables, whole grains, lean proteins, low-fat dairy, and heart-healthy fats.  Work with your health care provider or diet and nutrition specialist (dietitian) to adjust your eating plan to your individual calorie needs. This information is not intended to replace advice given to you by your health care provider. Make sure you discuss any questions you have with your health care provider. Document Revised: 06/01/2017 Document Reviewed: 06/12/2016 Elsevier Patient Education  2020 [place], [place] [facility]

## 2020-01-08 NOTE — Patient Instructions (Signed)
-Nice seeing you today!!  -Increase lieinopril to 40 mg daily.  -Start HCTZ 25 mg daily.  -Low salt diet (see below).  -Schedule follow up in 6 weeks.   DASH Eating Plan DASH stands for "Dietary Approaches to Stop Hypertension." The DASH eating plan is a healthy eating plan that has been shown to reduce high blood pressure (hypertension). It may also reduce your risk for type 2 diabetes, heart disease, and stroke. The DASH eating plan may also help with weight loss. What are tips for following this plan?  General guidelines  Avoid eating more than 2,300 mg (milligrams) of salt (sodium) a day. If you have hypertension, you may need to reduce your sodium intake to 1,500 mg a day.  Limit alcohol intake to no more than 1 drink a day for nonpregnant women and 2 drinks a day for men. One drink equals 12 oz of beer, 5 oz of wine, or 1 oz of hard liquor.  Work with your health care provider to maintain a healthy body weight or to lose weight. Ask what an ideal weight is for you.  Get at least 30 minutes of exercise that causes your heart to beat faster (aerobic exercise) most days of the week. Activities may include walking, swimming, or biking.  Work with your health care provider or diet and nutrition specialist (dietitian) to adjust your eating plan to your individual calorie needs. Reading food labels   Check food labels for the amount of sodium per serving. Choose foods with less than 5 percent of the Daily Value of sodium. Generally, foods with less than 300 mg of sodium per serving fit into this eating plan.  To find whole grains, look for the word "whole" as the first word in the ingredient list. Shopping  Buy products labeled as "low-sodium" or "no salt added."  Buy fresh foods. Avoid canned foods and premade or frozen meals. Cooking  Avoid adding salt when cooking. Use salt-free seasonings or herbs instead of table salt or sea salt. Check with your health care provider or  pharmacist before using salt substitutes.  Do not fry foods. Cook foods using healthy methods such as baking, boiling, grilling, and broiling instead.  Cook with heart-healthy oils, such as olive, canola, soybean, or sunflower oil. Meal planning  Eat a balanced diet that includes: ? 5 or more servings of fruits and vegetables each day. At each meal, try to fill half of your plate with fruits and vegetables. ? Up to 6-8 servings of whole grains each day. ? Less than 6 oz of lean meat, poultry, or fish each day. A 3-oz serving of meat is about the same size as a deck of cards. One egg equals 1 oz. ? 2 servings of low-fat dairy each day. ? A serving of nuts, seeds, or beans 5 times each week. ? Heart-healthy fats. Healthy fats called Omega-3 fatty acids are found in foods such as flaxseeds and coldwater fish, like sardines, salmon, and mackerel.  Limit how much you eat of the following: ? Canned or prepackaged foods. ? Food that is high in trans fat, such as fried foods. ? Food that is high in saturated fat, such as fatty meat. ? Sweets, desserts, sugary drinks, and other foods with added sugar. ? Full-fat dairy products.  Do not salt foods before eating.  Try to eat at least 2 vegetarian meals each week.  Eat more home-cooked food and less restaurant, buffet, and fast food.  When eating at a restaurant,  ask that your food be prepared with less salt or no salt, if possible. What foods are recommended? The items listed may not be a complete list. Talk with your dietitian about what dietary choices are best for you. Grains Whole-grain or whole-wheat bread. Whole-grain or whole-wheat pasta. Brown rice. [place]. Bulgur. Whole-grain and low-sodium cereals. Pita bread. Low-fat, low-sodium crackers. Whole-wheat flour tortillas. Vegetables Fresh or frozen vegetables (raw, steamed, roasted, or grilled). Low-sodium or reduced-sodium tomato and vegetable juice. Low-sodium or  reduced-sodium tomato sauce and tomato paste. Low-sodium or reduced-sodium canned vegetables. Fruits All fresh, dried, or frozen fruit. Canned fruit in natural juice (without added sugar). Meat and other protein foods Skinless chicken or [place]. Ground chicken or [place]. Pork with fat trimmed off. Fish and seafood. Egg whites. Dried beans, peas, or lentils. Unsalted nuts, nut butters, and seeds. Unsalted canned beans. Lean cuts of beef with fat trimmed off. Low-sodium, lean deli meat. Dairy Low-fat (1%) or fat-free (skim) milk. Fat-free, low-fat, or reduced-fat cheeses. Nonfat, low-sodium ricotta or cottage cheese. Low-fat or nonfat yogurt. Low-fat, low-sodium cheese. Fats and oils Soft margarine without trans fats. Vegetable oil. Low-fat, reduced-fat, or light mayonnaise and salad dressings (reduced-sodium). Canola, safflower, olive, soybean, and sunflower oils. Avocado. Seasoning and other foods Herbs. Spices. Seasoning mixes without salt. Unsalted popcorn and pretzels. Fat-free sweets. What foods are not recommended? The items listed may not be a complete list. Talk with your dietitian about what dietary choices are best for you. Grains Baked goods made with fat, such as croissants, muffins, or some breads. Dry pasta or rice meal packs. Vegetables Creamed or fried vegetables. Vegetables in a cheese sauce. Regular canned vegetables (not low-sodium or reduced-sodium). Regular canned tomato sauce and paste (not low-sodium or reduced-sodium). Regular tomato and vegetable juice (not low-sodium or reduced-sodium). Angie Fava. Olives. Fruits Canned fruit in a light or heavy syrup. Fried fruit. Fruit in cream or butter sauce. Meat and other protein foods Fatty cuts of meat. Ribs. Fried meat. Berniece Salines. Sausage. Bologna and other processed lunch meats. Salami. Fatback. Hotdogs. Bratwurst. Salted nuts and seeds. Canned beans with added salt. Canned or smoked fish. Whole eggs or egg yolks. Chicken or [place]  with skin. Dairy Whole or 2% milk, cream, and half-and-half. Whole or full-fat cream cheese. Whole-fat or sweetened yogurt. Full-fat cheese. Nondairy creamers. Whipped toppings. Processed cheese and cheese spreads. Fats and oils Butter. Stick margarine. Lard. Shortening. Ghee. Bacon fat. Tropical oils, such as coconut, palm kernel, or palm oil. Seasoning and other foods Salted popcorn and pretzels. Onion salt, garlic salt, seasoned salt, table salt, and sea salt. Worcestershire sauce. Tartar sauce. Barbecue sauce. Teriyaki sauce. Soy sauce, including reduced-sodium. Steak sauce. Canned and packaged gravies. Fish sauce. Oyster sauce. Cocktail sauce. Horseradish that you find on the shelf. Ketchup. Mustard. Meat flavorings and tenderizers. Bouillon cubes. Hot sauce and Tabasco sauce. Premade or packaged marinades. Premade or packaged taco seasonings. Relishes. Regular salad dressings. Where to find more information:  National Heart, Lung, and [place]: https://wilson-eaton.com/  American Heart Association: www.heart.org Summary  The DASH eating plan is a healthy eating plan that has been shown to reduce high blood pressure (hypertension). It may also reduce your risk for type 2 diabetes, heart disease, and stroke.  With the DASH eating plan, you should limit salt (sodium) intake to 2,300 mg a day. If you have hypertension, you may need to reduce your sodium intake to 1,500 mg a day.  When on the DASH eating plan, aim to eat more fresh  fruits and vegetables, whole grains, lean proteins, low-fat dairy, and heart-healthy fats.  Work with your health care provider or diet and nutrition specialist (dietitian) to adjust your eating plan to your individual calorie needs. This information is not intended to replace advice given to you by your health care provider. Make sure you discuss any questions you have with your health care provider. Document Revised: 06/01/2017 Document Reviewed:  06/12/2016 Elsevier Patient Education  2020 [employer].

## 2020-04-04 ENCOUNTER — Emergency Department (HOSPITAL_COMMUNITY): Payer: Self-pay

## 2020-04-04 ENCOUNTER — Emergency Department (HOSPITAL_COMMUNITY)
Admission: EM | Admit: 2020-04-04 | Discharge: 2020-04-04 | Disposition: A | Discharge status: Home / Self Care | Payer: Self-pay | Attending: Emergency Medicine | Admitting: Emergency Medicine

## 2020-04-04 ENCOUNTER — Other Ambulatory Visit: Payer: Self-pay

## 2020-04-04 ENCOUNTER — Encounter (HOSPITAL_COMMUNITY): Payer: Self-pay | Admitting: Obstetrics and Gynecology

## 2020-04-04 DIAGNOSIS — I10 Essential (primary) hypertension: Secondary | ICD-10-CM | POA: Insufficient documentation

## 2020-04-04 DIAGNOSIS — J45909 Unspecified asthma, uncomplicated: Secondary | ICD-10-CM | POA: Insufficient documentation

## 2020-04-04 DIAGNOSIS — Z79899 Other long term (current) drug therapy: Secondary | ICD-10-CM | POA: Insufficient documentation

## 2020-04-04 DIAGNOSIS — E119 Type 2 diabetes mellitus without complications: Secondary | ICD-10-CM | POA: Insufficient documentation

## 2020-04-04 DIAGNOSIS — R519 Headache, unspecified: Secondary | ICD-10-CM | POA: Insufficient documentation

## 2020-04-04 LAB — BASIC METABOLIC PANEL
Anion gap: 9 (ref 5–15)
BUN: 15 mg/dL (ref 6–20)
CO2: 28 mmol/L (ref 22–32)
Calcium: 9.2 mg/dL (ref 8.9–10.3)
Chloride: 99 mmol/L (ref 98–111)
Creatinine, Ser: 0.86 mg/dL (ref 0.44–1.00)
GFR calc Af Amer: >60 mL/min (ref >60)
GFR calc non Af Amer: >60 mL/min (ref >60)
Glucose, Bld: 94 mg/dL (ref 70–99)
Potassium: 4 mmol/L (ref 3.5–5.1)
Sodium: 136 mmol/L (ref 135–145)

## 2020-04-04 LAB — CBC
HCT: 41.5 % (ref 36.0–46.0)
Hemoglobin: 13.2 g/dL (ref 12.0–15.0)
MCH: 27.2 pg (ref 26.0–34.0)
MCHC: 31.8 g/dL (ref 30.0–36.0)
MCV: 85.4 fL (ref 80.0–100.0)
Platelets: 222 10*3/uL (ref 150–400)
RBC: 4.86 MIL/uL (ref 3.87–5.11)
RDW: 13.8 % (ref 11.5–15.5)
WBC: 5.4 10*3/uL (ref 4.0–10.5)
nRBC: 0 % (ref 0.0–0.2)

## 2020-04-04 LAB — TROPONIN I (HIGH SENSITIVITY): Troponin I (High Sensitivity): 11 ng/L (ref <18)

## 2020-04-04 MED ORDER — DICYCLOMINE HCL 10 MG PO CAPS: 10.0000 mg | ORAL_CAPSULE | Freq: Once | ORAL | Status: DC

## 2020-04-04 MED ORDER — KETOROLAC TROMETHAMINE 60 MG/2ML IM SOLN
60.0000 mg | Freq: Once | INTRAMUSCULAR | Status: AC
  Administered 2020-04-04: 16:00:00 60 mg via INTRAMUSCULAR
  Filled 2020-04-04: qty 2

## 2020-04-04 MED ORDER — CLONIDINE HCL 0.1 MG PO TABS
0.1000 mg | ORAL_TABLET | Freq: Once | ORAL | Status: AC
  Administered 2020-04-04: 17:00:00 0.1 mg via ORAL
  Filled 2020-04-04: qty 1

## 2020-04-04 NOTE — ED Provider Notes (Signed)
[facility] DEPT Provider Note   CSN: [id number] Arrival date & time: 04/04/20  1351     History Chief Complaint  Patient presents with  . Headache  . Hypertension    Annette Lindsey is a 59 y.o. female.  HPI 59 year old female with a history of asthma, DM type II, hyperlipidemia, hypertension presents to the ER with complaints of headache and elevated blood pressures over the weekend.  Patient states that she normally takes HCTZ and lisinopril, recently started on lisinopril, increased to 40 mg recently but states that she feels like it has not been working.  She said that she was on amlodipine prior and this seemed to work but her PCP wanted to switch her due to her history of diabetes.  She complains of headaches, blurry vision, severe frontal headache for the last 2 days.  Seen at urgent care and sent here for CT scan of the head.  She denies any nausea, vomiting, but does endorse some left-sided mild left-sided chest pain which she thought was nonsignificant.  She states the pain is constant.  She denies any back pain or neck pain.  Has not noticed any difficulty speaking, weakness, numbness of her extremities.  No prior history of strokes.  Denies any fevers or chills or neck stiffness.  She denies worse headache of her life, URI symptoms, cough, abdominal pain, nausea, vomiting, diarrhea, rashes, pain or burning with urination.    Past Medical History:  Diagnosis Date  . Asthma   . 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 Bruises easily   . Chest pain   . Constipation   . Diabetes mellitus without complication ([place])   . Generalized headaches   . Hypercholesteremia   . Hyperlipidemia   . Hypertension   . Metabolic syndrome X   . Nasal congestion   . Sore throat   . Visual disturbance     Patient Active Problem List   Diagnosis Date Noted  . 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 Fatigue due to sleep pattern disturbance  10/26/2015  . Morbid obesity due to excess calories ([place]) 10/26/2015  . Nocturia more than twice per night 10/26/2015  . Sepsis ([place]) 03/01/2015  . CAP (community acquired pneumonia) 03/01/2015  . HTN (hypertension) 03/01/2015  . HLD (hyperlipidemia) 03/01/2015  . Diabetes mellitus without complication ([place]) 35/00/9381  . 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 Obesity, Class III, BMI 40-49.9 (morbid obesity) ([place]) 07/31/2011    Past Surgical History:  Procedure Laterality Date  . ABDOMINAL HYSTERECTOMY  1988  . MASS EXCISION  08/18/11   right buttock     OB History   No obstetric history on file.     Family History  Problem Relation Age of Onset  . Cancer Father        lung  . Heart disease Mother     Social History   Tobacco Use  . Smoking status: Never Smoker  . Smokeless tobacco: Never Used  Substance Use Topics  . Alcohol use: No  . Drug use: No    Home Medications Prior to Admission medications   Medication Sig Start Date End Date Taking? Authorizing Provider  hydrochlorothiazide (HYDRODIURIL) 25 MG tablet Take 1 tablet (25 mg total) by mouth daily. 01/08/20   Isaac Bliss, Rayford Halsted, MD  lisinopril (ZESTRIL) 40 MG tablet Take 1 tablet (40 mg total) by mouth daily. 01/08/20   Isaac Bliss, Rayford Halsted, MD    Allergies    Other  and Shellfish-derived products  Review of Systems   Review of Systems  Constitutional: Negative for chills and fever.  HENT: Negative for ear pain and sore throat.   Eyes: Negative for pain and visual disturbance.  Respiratory: Negative for cough and shortness of breath.   Cardiovascular: Positive for chest pain. Negative for palpitations.  Gastrointestinal: Negative for abdominal pain and vomiting.  Genitourinary: Negative for dysuria and hematuria.  Musculoskeletal: Negative for arthralgias and back pain.  Skin: Negative for color change and rash.  Neurological: Positive for headaches. Negative for seizures and  syncope.  All other systems reviewed and are negative.   Physical Exam Updated Vital Signs BP (!) 163/114 (BP Location: Left Arm)   Pulse 67   Temp 98 F (36.7 C) (Oral)   Resp 11   SpO2 98%   Physical Exam Vitals and nursing note reviewed.  Constitutional:      General: She is not in acute distress.    Appearance: She is well-developed. She is obese. She is not ill-appearing, toxic-appearing or diaphoretic.  HENT:     Head: Normocephalic and atraumatic.  Eyes:     Conjunctiva/sclera: Conjunctivae normal.  Cardiovascular:     Rate and Rhythm: Normal rate and regular rhythm.     Heart sounds: Normal heart sounds. No murmur heard.   Pulmonary:     Effort: Pulmonary effort is normal. No respiratory distress.     Breath sounds: Normal breath sounds.  Abdominal:     Palpations: Abdomen is soft.     Tenderness: There is no abdominal tenderness.  Musculoskeletal:     Cervical back: Normal range of motion and neck supple.  Skin:    General: Skin is warm and dry.     Capillary Refill: Capillary refill takes less than 2 seconds.     Findings: No erythema or rash.  Neurological:     Mental Status: She is alert.     Sensory: No sensory deficit.     Motor: No weakness.     Deep Tendon Reflexes: Reflexes normal.     Comments: Mental Status:  Alert, thought content appropriate, able to give a coherent history. Speech fluent without evidence of aphasia. Able to follow 2 step commands without difficulty.  Cranial Nerves:  II: Peripheral visual fields grossly normal, pupils equal, round, reactive to light III,IV, VI: ptosis not present, extra-ocular motions intact bilaterally  V,VII: smile symmetric, facial light touch sensation equal VIII: hearing grossly normal to voice  X: uvula elevates symmetrically  XI: bilateral shoulder shrug symmetric and strong XII: midline tongue extension without fassiculations Motor:  Normal tone. 5/5 strength of BUE and BLE major muscle groups  including strong and equal grip strength and dorsiflexion/plantar flexion Sensory: light touch normal in all extremities. Cerebellar: normal finger-to-nose with bilateral upper extremities, Romberg sign absent Gait: normal gait and balance. Able to walk on toes and heels with ease.     Psychiatric:        Mood and Affect: Mood normal.        Speech: Speech normal.        Behavior: Behavior normal.     ED Results / Procedures / Treatments   Labs (all labs ordered are listed, but only abnormal results are displayed) Labs Reviewed  BASIC METABOLIC PANEL  CBC  TROPONIN I (HIGH SENSITIVITY)  TROPONIN I (HIGH SENSITIVITY)    EKG None  Radiology CT HEAD WO CONTRAST  Result Date: 04/04/2020 CLINICAL DATA:  Headache, nausea and vomiting EXAM: CT  HEAD WITHOUT CONTRAST TECHNIQUE: Contiguous axial images were obtained from the base of the skull through the vertex without intravenous contrast. COMPARISON:  None. FINDINGS: Brain: No evidence of acute infarction, hemorrhage, hydrocephalus, extra-axial collection or mass lesion/mass effect. Periventricular and deep white matter hypodensity. Vascular: No hyperdense vessel or unexpected calcification. Skull: Normal. Negative for fracture or focal lesion. Sinuses/Orbits: No acute finding. Other: None. IMPRESSION: No acute intracranial pathology. No non-contrast CT findings to explain headache. Small-vessel white matter disease. Electronically Signed   By: Eddie Candle M.D.   On: 04/04/2020 14:34   DG Chest Portable 1 View  Result Date: 04/04/2020 CLINICAL DATA:  Chest pain, nonsmoker EXAM: PORTABLE CHEST 1 VIEW COMPARISON:  Comparison chest x-ray 08/06/2017 FINDINGS: The heart size and mediastinal contours are within normal limits. No focal consolidation. No pulmonary edema. No pleural effusion. No pneumothorax. No acute osseous abnormality. IMPRESSION: No active disease. Electronically Signed   By: Iven Finn M.D.   On: 04/04/2020 16:45     Procedures Procedures (including critical care time)  Medications Ordered in ED Medications  ketorolac (TORADOL) injection 60 mg (60 mg Intramuscular Given 04/04/20 1553)  cloNIDine (CATAPRES) tablet 0.1 mg (0.1 mg Oral Given 04/04/20 1633)    ED Course  I have reviewed the triage vital signs and the nursing notes.  Pertinent labs & imaging results that were available during my care of the patient were reviewed by me and considered in my medical decision making (see chart for details).    MDM Rules/Calculators/A&P                         58 year old female with complaints of high blood pressures not controlled with her lisinopril and HCTZ and headache On presentation, she is alert, oriented, nontoxic-appearing, she is sitting in the ER chair with her eyes squinted, reporting sensitivity to light.  Vitals with a blood pressure on arrival of 208/114, however not tachycardic, tachypneic or hypoxic.  Her lungs are clear on exam, patient is not tachycardic.  Her neuro exam is largely unremarkable.  Abdomen is soft and nontender.  Her CBC and BMP are unremarkable, troponin of 11, it has been more than 4 hours since the patient's last episode of chest pain, suspicion for ACS is low at this time.  Chest x-ray is unremarkable.  CT head without evidence of intracranial hemorrhage. I discussed her tremor and fasciculations with Dr. Zenia Resides who does not feel she needs further evaluation of MRI.   Her renal function is normal.  Her EKG is normal sinus rhythm.  Patient was treated with some clonidine and Toradol for headache.  On re-evaluation patient's headache improved. Blood pressure has improved here in the ED to 163/114.  I encouraged the patient to follow-up with her primary care doctor for further readjustment of her blood pressure medications.  She voiced understanding and is agreeable.  Return precautions discussed.  At this stage in the ED course, the patient is medically screened and is stable for  discharge. Hemodynamically stable and afebrile.   Case discussed with Dr. Zenia Resides who is agreeable to the above plan and disposition.  Final Clinical Impression(s) / ED Diagnoses Final diagnoses:  Hypertension, unspecified type  Bad headache    Rx / DC Orders ED Discharge Orders    None       Lyndel Safe 04/04/20 1900    Lacretia Leigh, MD 04/07/20 [phone]

## 2020-04-04 NOTE — ED Notes (Signed)
Attempted to draw pt labs but was unsucessful

## 2020-04-04 NOTE — ED Notes (Signed)
Attempted to get blood but was unsuccessful 

## 2020-04-04 NOTE — ED Triage Notes (Signed)
Patient reports to the ER for headaches and high BP. Patient reports she was sent here from [facility] to get a CT scan of the head.

## 2020-04-04 NOTE — Discharge Instructions (Signed)
Your work-up today was overall reassuring.  Please take Tylenol or ibuprofen for headaches.  Please call your primary care doctor tomorrow and schedule an appointment for reevaluation of your high blood pressure.  Return to the ER if you have new or worsening symptoms

## 2020-04-06 ENCOUNTER — Telehealth: Payer: Self-pay | Admitting: Internal Medicine

## 2020-04-06 NOTE — Telephone Encounter (Signed)
She was last seen on 7/8 with uncontrolled BP, changes were made and she was asked to do a 6 week follow up, which I see has not happened. She will need to make an appointment.

## 2020-04-06 NOTE — Telephone Encounter (Signed)
Patient is wanting BP medicine changed due to her BP still not being controlled.  [phone]  Pt is requesting to be back on Amlodipine 10 mg which is what she used to take.    Pharmacy- [facility] on [address]

## 2020-04-07 NOTE — Telephone Encounter (Signed)
Patient is aware.  Appointment scheduled.

## 2020-04-22 ENCOUNTER — Ambulatory Visit: Payer: Self-pay | Admitting: Internal Medicine

## 2020-04-23 ENCOUNTER — Ambulatory Visit (INDEPENDENT_AMBULATORY_CARE_PROVIDER_SITE_OTHER): Payer: Self-pay | Admitting: Internal Medicine

## 2020-04-23 ENCOUNTER — Encounter: Payer: Self-pay | Admitting: Internal Medicine

## 2020-04-23 ENCOUNTER — Other Ambulatory Visit: Payer: Self-pay

## 2020-04-23 VITALS — BP 148/100 | HR 87 | Temp 98.1°F | Wt 262.9 lb

## 2020-04-23 DIAGNOSIS — I10 Essential (primary) hypertension: Secondary | ICD-10-CM

## 2020-04-23 MED ORDER — AMLODIPINE BESYLATE 10 MG PO TABS: 10.0000 mg | ORAL_TABLET | Freq: Every day | ORAL | 1 refills | Status: DC

## 2020-04-23 MED ORDER — HYDROCHLOROTHIAZIDE 25 MG PO TABS: 25.0000 mg | ORAL_TABLET | Freq: Every day | ORAL | 1 refills | Status: DC

## 2020-04-23 NOTE — Patient Instructions (Signed)
-  Nice seeing you today!!  -Continue HCTZ 25 mg daily.  -Add amlodipine 10 mg daily.  -Schedule follow up in 6 weeks.

## 2020-04-23 NOTE — Progress Notes (Signed)
Established Patient Office Visit     This visit occurred during the SARS-CoV-2 public health emergency.  Safety protocols were in place, including screening questions prior to the visit, additional usage of staff PPE, and extensive cleaning of exam room while observing appropriate contact time as indicated for disinfecting solutions.    CC/Reason for Visit: Blood pressure follow-up  HPI: Annette Lindsey is a 59 y.o. female who is coming in today for the above mentioned reasons.  She is here today as a 6-week follow-up for hypertension.  At last visit her lisinopril was increased from 20 to 40 mg and hydrochlorothiazide was added.  She has been compliant with hydrochlorothiazide but not with lisinopril.  She states she only takes it "once in a while".  She states she does not like how it makes her feel.  When further questioned she tells me that it causes a really bad dry cough.  She had not disclosed this to me".  Other than that she has been feeling well.  She received her flu vaccine in September.   Past Medical/Surgical History: Past Medical History:  Diagnosis Date  . Asthma   . 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 Bruises easily   . Chest pain   . Constipation   . Diabetes mellitus without complication ([place])   . Generalized headaches   . Hypercholesteremia   . Hyperlipidemia   . Hypertension   . Metabolic syndrome X   . Nasal congestion   . Sore throat   . Visual disturbance     Past Surgical History:  Procedure Laterality Date  . ABDOMINAL HYSTERECTOMY  1988  . MASS EXCISION  08/18/11   right buttock    Social History:  reports that she has never smoked. She has never used smokeless tobacco. She reports that she does not drink alcohol and does not use drugs.  Allergies: Allergies  Allergen Reactions  . Other Anaphylaxis    Patient has this reaction to grass. Patient also has asthma reaction to smoke (when in closed spaces - cooking,  etc.)  . Shellfish-Derived Products Swelling    Of throat  . Lisinopril Cough    Family History:  Family History  Problem Relation Age of Onset  . Cancer Father        lung  . Heart disease Mother      Current Outpatient Medications:  .  hydrochlorothiazide (HYDRODIURIL) 25 MG tablet, Take 1 tablet (25 mg total) by mouth daily., Disp: 90 tablet, Rfl: 1 .  amLODipine (NORVASC) 10 MG tablet, Take 1 tablet (10 mg total) by mouth daily., Disp: 90 tablet, Rfl: 1  Review of Systems:  Constitutional: Denies fever, chills, diaphoresis, appetite change and fatigue.  HEENT: Denies photophobia, eye pain, redness, hearing loss, ear pain, congestion, sore throat, rhinorrhea, sneezing, mouth sores, trouble swallowing, neck pain, neck stiffness and tinnitus.   Respiratory: Denies SOB, DOE,  chest tightness,  and wheezing.   Cardiovascular: Denies chest pain, palpitations and leg swelling.  Gastrointestinal: Denies nausea, vomiting, abdominal pain, diarrhea, constipation, blood in stool and abdominal distention.  Genitourinary: Denies dysuria, urgency, frequency, hematuria, flank pain and difficulty urinating.  Endocrine: Denies: hot or cold intolerance, sweats, changes in hair or nails, polyuria, polydipsia. Musculoskeletal: Denies myalgias, back pain, joint swelling, arthralgias and gait problem.  Skin: Denies pallor, rash and wound.  Neurological: Denies dizziness, seizures, syncope, weakness, light-headedness, numbness and headaches.  Hematological: Denies adenopathy. Easy bruising, personal or family bleeding history  Psychiatric/Behavioral:  Denies suicidal ideation, mood changes, confusion, nervousness, sleep disturbance and agitation    Physical Exam: Vitals:   04/23/20 0811  BP: (!) 148/100  Pulse: 87  Temp: 98.1 F (36.7 C)  TempSrc: Oral  SpO2: 95%  Weight: 262 lb 14.4 oz (119.3 kg)    Body mass index is 42.43 kg/m.   Constitutional: NAD, calm, comfortable Eyes: PERRL,  lids and conjunctivae normal ENMT: Mucous membranes are moist.  Respiratory: clear to auscultation bilaterally, no wheezing, no crackles. Normal respiratory effort. No accessory muscle use.  Cardiovascular: Regular rate and rhythm, no murmurs / rubs / gallops. No extremity edema.   Neurologic: Grossly intact and nonfocal Psychiatric: Normal judgment and insight. Alert and oriented x 3. Normal mood.    Impression and Plan:  Primary hypertension -Blood pressure is not well controlled. -I will mark her allergy to ACE inhibitor. -She will continue hydrochlorothiazide 25 mg daily and we will add amlodipine 10 mg daily at her request. -She will do ambulatory blood pressure monitoring and return in 6 weeks for follow-up.  Morbid obesity ([place]) -Discussed healthy lifestyle, including increased physical activity and better food choices to promote weight loss.    Patient Instructions  -Nice seeing you today!!  -Continue HCTZ 25 mg daily.  -Add amlodipine 10 mg daily.  -Schedule follow up in 6 weeks.     Lelon Frohlich, MD [facility]

## 2020-05-07 ENCOUNTER — Encounter (HOSPITAL_COMMUNITY): Payer: Self-pay

## 2020-05-07 ENCOUNTER — Emergency Department (HOSPITAL_COMMUNITY)
Admission: EM | Admit: 2020-05-07 | Discharge: 2020-05-07 | Disposition: A | Discharge status: Home / Self Care | Payer: Self-pay | Attending: Emergency Medicine | Admitting: Emergency Medicine

## 2020-05-07 ENCOUNTER — Emergency Department (HOSPITAL_COMMUNITY): Payer: Self-pay

## 2020-05-07 DIAGNOSIS — E119 Type 2 diabetes mellitus without complications: Secondary | ICD-10-CM | POA: Insufficient documentation

## 2020-05-07 DIAGNOSIS — M791 Myalgia, unspecified site: Secondary | ICD-10-CM | POA: Insufficient documentation

## 2020-05-07 DIAGNOSIS — Y92410 Unspecified street and highway as the place of occurrence of the external cause: Secondary | ICD-10-CM | POA: Insufficient documentation

## 2020-05-07 DIAGNOSIS — M25552 Pain in left hip: Secondary | ICD-10-CM | POA: Diagnosis not present

## 2020-05-07 DIAGNOSIS — I10 Essential (primary) hypertension: Secondary | ICD-10-CM | POA: Diagnosis not present

## 2020-05-07 DIAGNOSIS — J45909 Unspecified asthma, uncomplicated: Secondary | ICD-10-CM | POA: Insufficient documentation

## 2020-05-07 DIAGNOSIS — M7918 Myalgia, other site: Secondary | ICD-10-CM

## 2020-05-07 DIAGNOSIS — M25512 Pain in left shoulder: Secondary | ICD-10-CM | POA: Insufficient documentation

## 2020-05-07 DIAGNOSIS — Z79899 Other long term (current) drug therapy: Secondary | ICD-10-CM | POA: Diagnosis not present

## 2020-05-07 MED ORDER — ORPHENADRINE CITRATE ER 100 MG PO TB12: 100.0000 mg | ORAL_TABLET | Freq: Two times a day (BID) | ORAL | 0 refills | Status: DC
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
## 2020-05-07 NOTE — Discharge Instructions (Signed)
Warm compresses to sore muscles. Take Aleve and Tylenol as needed as directed for body aches. Take Norflex as needed for muscle tightness.  Do not drive or operate machinery while taking Norflex.

## 2020-08-26 ENCOUNTER — Encounter (HOSPITAL_COMMUNITY): Payer: Self-pay | Admitting: Emergency Medicine

## 2020-08-26 ENCOUNTER — Emergency Department (HOSPITAL_COMMUNITY)
Admission: EM | Admit: 2020-08-26 | Discharge: 2020-08-27 | Disposition: A | Discharge status: Home / Self Care | Payer: 59 | Attending: Emergency Medicine | Admitting: Emergency Medicine

## 2020-08-26 ENCOUNTER — Other Ambulatory Visit: Payer: Self-pay

## 2020-08-26 DIAGNOSIS — Z79899 Other long term (current) drug therapy: Secondary | ICD-10-CM | POA: Insufficient documentation

## 2020-08-26 DIAGNOSIS — R2231 Localized swelling, mass and lump, right upper limb: Secondary | ICD-10-CM | POA: Insufficient documentation

## 2020-08-26 DIAGNOSIS — I10 Essential (primary) hypertension: Secondary | ICD-10-CM | POA: Insufficient documentation

## 2020-08-26 DIAGNOSIS — L0390 Cellulitis, unspecified: Secondary | ICD-10-CM

## 2020-08-26 DIAGNOSIS — E119 Type 2 diabetes mellitus without complications: Secondary | ICD-10-CM | POA: Diagnosis not present

## 2020-08-26 DIAGNOSIS — J45909 Unspecified asthma, uncomplicated: Secondary | ICD-10-CM | POA: Insufficient documentation

## 2020-08-26 LAB — COMPREHENSIVE METABOLIC PANEL
ALT: 25 U/L (ref 0–44)
AST: 40 U/L (ref 15–41)
Albumin: 3.7 g/dL (ref 3.5–5.0)
Alkaline Phosphatase: 71 U/L (ref 38–126)
Anion gap: 9 (ref 5–15)
BUN: 16 mg/dL (ref 6–20)
CO2: 26 mmol/L (ref 22–32)
Calcium: 9.4 mg/dL (ref 8.9–10.3)
Chloride: 103 mmol/L (ref 98–111)
Creatinine, Ser: 0.9 mg/dL (ref 0.44–1.00)
GFR, Estimated: >60 mL/min (ref >60)
Glucose, Bld: 95 mg/dL (ref 70–99)
Potassium: 4.1 mmol/L (ref 3.5–5.1)
Sodium: 138 mmol/L (ref 135–145)
Total Bilirubin: 1.1 mg/dL (ref 0.3–1.2)
Total Protein: 7 g/dL (ref 6.5–8.1)

## 2020-08-26 LAB — CBC WITH DIFFERENTIAL/PLATELET
Abs Immature Granulocytes: 0.03 10*3/uL (ref 0.00–0.07)
Basophils Absolute: 0 10*3/uL (ref 0.0–0.1)
Basophils Relative: 0 %
Eosinophils Absolute: 0.2 10*3/uL (ref 0.0–0.5)
Eosinophils Relative: 2 %
HCT: 40.2 % (ref 36.0–46.0)
Hemoglobin: 12.7 g/dL (ref 12.0–15.0)
Immature Granulocytes: 0 %
Lymphocytes Relative: 30 %
Lymphs Abs: 2.2 10*3/uL (ref 0.7–4.0)
MCH: 27.7 pg (ref 26.0–34.0)
MCHC: 31.6 g/dL (ref 30.0–36.0)
MCV: 87.6 fL (ref 80.0–100.0)
Monocytes Absolute: 0.5 10*3/uL (ref 0.1–1.0)
Monocytes Relative: 7 %
Neutro Abs: 4.4 10*3/uL (ref 1.7–7.7)
Neutrophils Relative %: 61 %
Platelets: 276 10*3/uL (ref 150–400)
RBC: 4.59 MIL/uL (ref 3.87–5.11)
RDW: 13.9 % (ref 11.5–15.5)
WBC: 7.3 10*3/uL (ref 4.0–10.5)
nRBC: 0 % (ref 0.0–0.2)

## 2020-08-26 LAB — LACTIC ACID, PLASMA: Lactic Acid, Venous: 1.5 mmol/L (ref 0.5–1.9)

## 2020-08-26 MED ORDER — KETOROLAC TROMETHAMINE 60 MG/2ML IM SOLN
60.0000 mg | Freq: Once | INTRAMUSCULAR | Status: AC
  Administered 2020-08-26: 60 mg via INTRAMUSCULAR
  Filled 2020-08-26: qty 2

## 2020-08-26 MED ORDER — SULFAMETHOXAZOLE-TRIMETHOPRIM 800-160 MG PO TABS
1.0000 | ORAL_TABLET | Freq: Once | ORAL | Status: AC
  Administered 2020-08-26: 1 via ORAL
  Filled 2020-08-26: qty 1

## 2020-08-26 MED ORDER — SULFAMETHOXAZOLE-TRIMETHOPRIM 800-160 MG PO TABS: 1.0000 | ORAL_TABLET | Freq: Two times a day (BID) | ORAL | 0 refills | Status: AC

## 2020-08-26 NOTE — ED Triage Notes (Signed)
Pt c/o right forearm swelling and pain that radiates up to her shoulder. Arm is warm to touch.

## 2020-08-26 NOTE — Discharge Instructions (Signed)
You have been seen and discharged from the emergency department.  Follow-up with your primary provider for reevaluation. Take new antibiotic as directed and home medications as prescribed. If you have any worsening symptoms, swelling of the upper arm, spreading redness, fevers, or further concerns for health please return to an emergency department for further evaluation.

## 2020-08-26 NOTE — ED Provider Notes (Signed)
[facility] EMERGENCY DEPARTMENT Provider Note   CSN: [id number] Arrival date & time: 08/26/20  1629     History Chief Complaint  Patient presents with  . Arm Swelling    Annette Lindsey is a 60 y.o. female.  HPI   60 year old female presents the emergency department with right forearm swelling and pain.  Patient states that this started this afternoon around 2 PM.  She does not believe that she injured the area or had a bug bite.  Initially the area on the right forearm was itchy and uncomfortable and it progressed to being swollen, red and warm.  The pain now radiates down into the hand and up into the shoulder.  Denies any fevers.  No injury to the right forearm.  Past Medical History:  Diagnosis Date  . Asthma   . 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 Bruises easily   . Chest pain   . Constipation   . Diabetes mellitus without complication ([place])   . Generalized headaches   . Hypercholesteremia   . Hyperlipidemia   . Hypertension   . Metabolic syndrome X   . Nasal congestion   . Sore throat   . Visual disturbance     Patient Active Problem List   Diagnosis Date Noted  . 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 Fatigue due to sleep pattern disturbance 10/26/2015  . Morbid obesity due to excess calories ([place]) 10/26/2015  . Nocturia more than twice per night 10/26/2015  . Sepsis ([place]) 03/01/2015  . CAP (community acquired pneumonia) 03/01/2015  . HTN (hypertension) 03/01/2015  . HLD (hyperlipidemia) 03/01/2015  . Diabetes mellitus without complication ([place]) 19/14/7829  . 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 Obesity, Class III, BMI 40-49.9 (morbid obesity) ([place]) 07/31/2011    Past Surgical History:  Procedure Laterality Date  . ABDOMINAL HYSTERECTOMY  1988  . MASS EXCISION  08/18/11   right buttock     OB History   No obstetric history on file.     Family History  Problem Relation Age of Onset  .  Cancer Father        lung  . Heart disease Mother     Social History   Tobacco Use  . Smoking status: Never Smoker  . Smokeless tobacco: Never Used  Substance Use Topics  . Alcohol use: No  . Drug use: No    Home Medications Prior to Admission medications   Medication Sig Start Date End Date Taking? Authorizing Provider  amLODipine (NORVASC) 10 MG tablet Take 1 tablet (10 mg total) by mouth daily. 04/23/20   Isaac Bliss, Rayford Halsted, MD  hydrochlorothiazide (HYDRODIURIL) 25 MG tablet Take 1 tablet (25 mg total) by mouth daily. 04/23/20   Isaac Bliss, Rayford Halsted, MD  orphenadrine (NORFLEX) 100 MG tablet Take 1 tablet (100 mg total) by mouth 2 (two) times daily. 05/07/20   Tacy Learn, PA-C    Allergies    Other, Shellfish-derived products, and Lisinopril  Review of Systems   Review of Systems  Constitutional: Negative for chills and fever.  HENT: Negative for congestion.   Respiratory: Negative for shortness of breath.   Cardiovascular: Negative for chest pain.  Gastrointestinal: Negative for abdominal pain, diarrhea and vomiting.  Genitourinary: Negative for dysuria.  Musculoskeletal:       + Area of right forearm redness and warmth/pain  Skin:       Right forearm redness and pain  Neurological: Negative for headaches.  Physical Exam Updated Vital Signs BP (!) 130/97   Pulse 88   Temp 98.9 F (37.2 C) (Oral)   Resp 17   SpO2 100%   Physical Exam Vitals and nursing note reviewed.  Constitutional:      Appearance: Normal appearance.  HENT:     Head: Normocephalic.     Mouth/Throat:     Mouth: Mucous membranes are moist.  Cardiovascular:     Rate and Rhythm: Normal rate.  Pulmonary:     Effort: Pulmonary effort is normal. No respiratory distress.  Abdominal:     Palpations: Abdomen is soft.     Tenderness: There is no abdominal tenderness.  Musculoskeletal:     Comments: On the ulnar aspect of the right forearm is an area of swelling  approximately 6 inches long in a circumferential manner with associated redness and warmth.  At the center of this is an area of dried irritated appearing skin.  No blister/pustule, no open wound or drainage.  Normal radial pulse and capillary refill, the swelling and redness is focal, no tracking cellulitis, no significant axillary lymphadenopathy  Skin:    General: Skin is warm.  Neurological:     Mental Status: She is alert and oriented to person, place, and time. Mental status is at baseline.  Psychiatric:        Mood and Affect: Mood normal.     ED Results / Procedures / Treatments   Labs (all labs ordered are listed, but only abnormal results are displayed) Labs Reviewed  LACTIC ACID, PLASMA  COMPREHENSIVE METABOLIC PANEL  CBC WITH DIFFERENTIAL/PLATELET  LACTIC ACID, PLASMA    EKG None  Radiology No results found.  Procedures Procedures   Medications Ordered in ED Medications  sulfamethoxazole-trimethoprim (BACTRIM DS) 800-160 MG per tablet 1 tablet (has no administration in time range)  ketorolac (TORADOL) injection 60 mg (has no administration in time range)    ED Course  I have reviewed the triage vital signs and the nursing notes.  Pertinent labs & imaging results that were available during my care of the patient were reviewed by me and considered in my medical decision making (see chart for details).    MDM Rules/Calculators/A&P                          Patient has a focal area over the ulnar aspect of the right forearm that is swollen, red and warm, no tracking cellulitis.  This happened over the course of a couple hours today, low suspicion for abscess given its sudden course.  Low suspicion for DVT given the area so focal.  There is an area of raw skin in the middle but does not appear to be a blister/pustule.  Blood work is completely normal.  After Toradol her discomfort is significantly improved.  Unclear what started this event, plan to treat for  cellulitis and pain.  Patient will be discharged and treated as an outpatient.  Discharge plan and strict return to ED precautions discussed, patient verbalizes understanding and agreement.  Final Clinical Impression(s) / ED Diagnoses Final diagnoses:  None    Rx / DC Orders ED Discharge Orders    None       Lorelle Gibbs, DO 08/27/20 0007

## 2020-08-29 ENCOUNTER — Encounter (HOSPITAL_COMMUNITY): Payer: Self-pay | Admitting: *Deleted

## 2020-08-29 ENCOUNTER — Ambulatory Visit (HOSPITAL_COMMUNITY)
Admission: EM | Admit: 2020-08-29 | Discharge: 2020-08-29 | Disposition: A | Discharge status: Home / Self Care | Payer: 59 | Attending: Family Medicine | Admitting: Family Medicine

## 2020-08-29 ENCOUNTER — Other Ambulatory Visit: Payer: Self-pay

## 2020-08-29 DIAGNOSIS — M7989 Other specified soft tissue disorders: Secondary | ICD-10-CM | POA: Insufficient documentation

## 2020-08-29 LAB — CBC WITH DIFFERENTIAL/PLATELET
Abs Immature Granulocytes: 0.02 10*3/uL (ref 0.00–0.07)
Basophils Absolute: 0 10*3/uL (ref 0.0–0.1)
Basophils Relative: 0 %
Eosinophils Absolute: 0.2 10*3/uL (ref 0.0–0.5)
Eosinophils Relative: 4 %
HCT: 39.4 % (ref 36.0–46.0)
Hemoglobin: 13 g/dL (ref 12.0–15.0)
Immature Granulocytes: 0 %
Lymphocytes Relative: 36 %
Lymphs Abs: 1.9 10*3/uL (ref 0.7–4.0)
MCH: 28.4 pg (ref 26.0–34.0)
MCHC: 33 g/dL (ref 30.0–36.0)
MCV: 86 fL (ref 80.0–100.0)
Monocytes Absolute: 0.4 10*3/uL (ref 0.1–1.0)
Monocytes Relative: 7 %
Neutro Abs: 2.7 10*3/uL (ref 1.7–7.7)
Neutrophils Relative %: 53 %
Platelets: 243 10*3/uL (ref 150–400)
RBC: 4.58 MIL/uL (ref 3.87–5.11)
RDW: 14.2 % (ref 11.5–15.5)
WBC: 5.2 10*3/uL (ref 4.0–10.5)
nRBC: 0 % (ref 0.0–0.2)

## 2020-08-29 MED ORDER — TRIAMCINOLONE ACETONIDE 0.1 % EX CREA: 1.0000 "application " | TOPICAL_CREAM | Freq: Two times a day (BID) | CUTANEOUS | 0 refills | Status: DC | PRN

## 2020-08-29 MED ORDER — PREDNISONE 10 MG PO TABS: ORAL_TABLET | ORAL | 0 refills | Status: DC

## 2020-08-29 NOTE — ED Provider Notes (Signed)
[facility]    CSN: [id number] Arrival date & time: 08/29/20  1004      History   Chief Complaint Chief Complaint  Patient presents with  . Arm Swelling    HPI Annette Lindsey is a 60 y.o. female.   Patient presenting today for 3-day history of progressively worsening right arm redness swelling and tenderness.  She also has a 1 cm blister that has formed at the forearm since onset.  She was seen in the ED several hours after onset for this issue, and had some temporary relief with IM Toradol and a dose of Bactrim.  Currently on day 3 of her course of Bactrim but erythema continues to spread up into upper arm/axillary area and swelling continues to progress.  Per record review, labs in the ED came back benign and she has remained afebrile, no shortness of breath, tongue or throat swelling, headache, new exposures that she is aware of, injury to the area, history of the same.     Past Medical History:  Diagnosis Date  . Asthma   . 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 Bruises easily   . Chest pain   . Constipation   . Diabetes mellitus without complication ([place])   . Generalized headaches   . Hypercholesteremia   . Hyperlipidemia   . Hypertension   . Metabolic syndrome X   . Nasal congestion   . Sore throat   . Visual disturbance     Patient Active Problem List   Diagnosis Date Noted  . 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 Fatigue due to sleep pattern disturbance 10/26/2015  . Morbid obesity due to excess calories ([place]) 10/26/2015  . Nocturia more than twice per night 10/26/2015  . Sepsis ([place]) 03/01/2015  . CAP (community acquired pneumonia) 03/01/2015  . HTN (hypertension) 03/01/2015  . HLD (hyperlipidemia) 03/01/2015  . Diabetes mellitus without complication ([place]) 24/03/7352  . 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 Obesity, Class III, BMI 40-49.9 (morbid obesity) ([place]) 07/31/2011    Past Surgical History:   Procedure Laterality Date  . ABDOMINAL HYSTERECTOMY  1988  . MASS EXCISION  08/18/11   right buttock    OB History   No obstetric history on file.      Home Medications    Prior to Admission medications   Medication Sig Start Date End Date Taking? Authorizing Provider  predniSONE (DELTASONE) 10 MG tablet Take 6 tabs day one, 5 tabs day two, 4 tabs day three, etc 08/29/20  Yes Volney American, PA-C  triamcinolone (KENALOG) 0.1 % Apply 1 application topically 2 (two) times daily as needed. 08/29/20  Yes Volney American, PA-C  amLODipine (NORVASC) 10 MG tablet Take 1 tablet (10 mg total) by mouth daily. 04/23/20   Isaac Bliss, Rayford Halsted, MD  hydrochlorothiazide (HYDRODIURIL) 25 MG tablet Take 1 tablet (25 mg total) by mouth daily. 04/23/20   Isaac Bliss, Rayford Halsted, MD  orphenadrine (NORFLEX) 100 MG tablet Take 1 tablet (100 mg total) by mouth 2 (two) times daily. 05/07/20   Tacy Learn, PA-C  sulfamethoxazole-trimethoprim (BACTRIM DS) 800-160 MG tablet Take 1 tablet by mouth 2 (two) times daily for 7 days. 08/26/20 09/02/20  Horton, Alvin Critchley, DO    Family History Family History  Problem Relation Age of Onset  . Cancer Father        lung  . Heart disease Mother     Social History Social History   Tobacco Use  .  Smoking status: Never Smoker  . Smokeless tobacco: Never Used  Substance Use Topics  . Alcohol use: No  . Drug use: No     Allergies   Other, Shellfish-derived products, and Lisinopril   Review of Systems Review of Systems Per HPI  Physical Exam Triage Vital Signs ED Triage Vitals [08/29/20 1021]  Enc Vitals Group     BP (!) 147/87     Pulse Rate 78     Resp 18     Temp 97.8 F (36.6 C)     Temp Source Oral     SpO2 98 %     Weight      Height      Head Circumference      Peak Flow      Pain Score 4     Pain Loc      Pain Edu?      Excl. in [place]?    No data found.  Updated Vital Signs BP (!) 147/87 (BP Location: Left  Arm)   Pulse 78   Temp 97.8 F (36.6 C) (Oral)   Resp 18   SpO2 98%   Visual Acuity Right Eye Distance:   Left Eye Distance:   Bilateral Distance:    Right Eye Near:   Left Eye Near:    Bilateral Near:     Physical Exam Vitals and nursing note reviewed.  Constitutional:      Appearance: Normal appearance. She is not ill-appearing.  HENT:     Head: Atraumatic.  Eyes:     Extraocular Movements: Extraocular movements intact.     Conjunctiva/sclera: Conjunctivae normal.  Cardiovascular:     Rate and Rhythm: Normal rate and regular rhythm.     Pulses: Normal pulses.     Heart sounds: Normal heart sounds.  Pulmonary:     Effort: Pulmonary effort is normal. No respiratory distress.     Breath sounds: Normal breath sounds. No wheezing or rales.  Musculoskeletal:        General: Swelling present. Normal range of motion.     Cervical back: Normal range of motion and neck supple.  Skin:    General: Skin is warm and dry.     Findings: Erythema present.     Comments: 1 cm blister present right extensor surface of forearm Diffuse erythema and edema from proximal right hand up through right axilla, worse throughout forearm  Neurological:     Mental Status: She is alert and oriented to person, place, and time.     Sensory: No sensory deficit.     Motor: No weakness.     Gait: Gait normal.     Comments: Right upper extremity neurovascularly intact  Psychiatric:        Mood and Affect: Mood normal.        Thought Content: Thought content normal.        Judgment: Judgment normal.     UC Treatments / Results  Labs (all labs ordered are listed, but only abnormal results are displayed) Labs Reviewed  CBC WITH DIFFERENTIAL/PLATELET    EKG   Radiology No results found.  Procedures Procedures (including critical care time)  Medications Ordered in UC Medications - No data to display  Initial Impression / Assessment and Plan / UC Course  I have reviewed the triage vital  signs and the nursing notes.  Pertinent labs & imaging results that were available during my care of the patient were reviewed by me and considered in my medical  decision making (see chart for details).     Already on course of Bactrim from the ED, but symptoms continue to progress.  Will start a round of prednisone and topical triamcinolone in hopes of reducing the inflammatory response to the area.  Antihistamines 1-2 times daily.  Close follow-up with primary care in the next few days for a recheck.  CBC pending.  She is hemodynamically stable for discharge and outpatient follow-up in the next few days.  Final Clinical Impressions(s) / UC Diagnoses   Final diagnoses:  Arm swelling     Discharge Instructions     Follow-up in the next few days with your primary care provider for recheck    ED Prescriptions    Medication Sig Dispense Auth. Provider   predniSONE (DELTASONE) 10 MG tablet Take 6 tabs day one, 5 tabs day two, 4 tabs day three, etc 21 tablet Volney American, PA-C   triamcinolone (KENALOG) 0.1 % Apply 1 application topically 2 (two) times daily as needed. 60 g Volney American, [place]     PDMP not reviewed this encounter.   Volney American, [place] 08/29/20 1113

## 2020-08-29 NOTE — ED Triage Notes (Signed)
Pt presents today with on going Rt arm swelling with out injuery. Pt was seen 2-24 -2022 for same.

## 2020-08-29 NOTE — Discharge Instructions (Addendum)
Follow-up in the next few days with your primary care provider for recheck

## 2020-09-03 ENCOUNTER — Other Ambulatory Visit: Payer: Self-pay

## 2020-09-03 ENCOUNTER — Ambulatory Visit (HOSPITAL_COMMUNITY)
Admission: EM | Admit: 2020-09-03 | Discharge: 2020-09-03 | Disposition: A | Discharge status: Home / Self Care | Payer: 59 | Attending: Emergency Medicine | Admitting: Emergency Medicine

## 2020-09-03 ENCOUNTER — Encounter (HOSPITAL_COMMUNITY): Payer: Self-pay

## 2020-09-03 DIAGNOSIS — R21 Rash and other nonspecific skin eruption: Secondary | ICD-10-CM

## 2020-09-03 MED ORDER — DOXYCYCLINE HYCLATE 100 MG PO CAPS: 100.0000 mg | ORAL_CAPSULE | Freq: Two times a day (BID) | ORAL | 0 refills | Status: AC

## 2020-09-03 MED ORDER — LIDOCAINE HCL (PF) 1 % IJ SOLN
INTRAMUSCULAR | Status: AC
  Filled 2020-09-03: qty 4

## 2020-09-03 MED ORDER — CEFTRIAXONE SODIUM 1 G IJ SOLR
1.0000 g | Freq: Once | INTRAMUSCULAR | Status: AC
  Administered 2020-09-03: 1 g via INTRAMUSCULAR

## 2020-09-03 MED ORDER — DEXAMETHASONE SODIUM PHOSPHATE 10 MG/ML IJ SOLN
INTRAMUSCULAR | Status: AC
  Filled 2020-09-03: qty 1

## 2020-09-03 MED ORDER — CEFTRIAXONE SODIUM 1 G IJ SOLR
INTRAMUSCULAR | Status: AC
  Filled 2020-09-03: qty 10

## 2020-09-03 MED ORDER — DEXAMETHASONE SODIUM PHOSPHATE 10 MG/ML IJ SOLN
10.0000 mg | Freq: Once | INTRAMUSCULAR | Status: AC
  Administered 2020-09-03: 10 mg via INTRAMUSCULAR

## 2020-09-03 NOTE — ED Triage Notes (Signed)
Pt presents with rash in the left arm x 2 days. States she had the same rash in right arm, and was prescribed Prednisone, today was the lats dose.

## 2020-09-03 NOTE — Discharge Instructions (Addendum)
Keep a close eye on your rash.  If it spreads or gets larger, head to the emergency room.    Take doxycycline 1 pill twice a day for 1 week.    You can continue to use the Kenalog cream for itching.   Return or go to the Emergency Department if symptoms worsen or do not improve in the next few days.

## 2020-09-03 NOTE — ED Provider Notes (Signed)
[facility]   [id number] 09/03/20 Arrival Time: 6378  CC: RASH  SUBJECTIVE:  Annette Lindsey is a 60 y.o. female who presents with a skin complaint that began Thursday.  Denies precipitating event or trauma.  Denies changes in soaps, detergents, close contacts with similar rash, known trigger or environmental trigger, allergy. Denies medications change or starting a new medication recently.  Localizes the rash to left arm.  Recently treated with antibiotics and steroids for rash on right arm that has improved.  There are no aggravating or alleviating factors.  Denies fever, chills, nausea, vomiting, erythema, swelling, discharge, oral lesions, SOB, chest pain, abdominal pain, changes in bowel or bladder function.    ROS: As per HPI.  All other pertinent ROS negative.     Past Medical History:  Diagnosis Date  . Asthma   . 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 Bruises easily   . Chest pain   . Constipation   . Diabetes mellitus without complication ([place])   . Generalized headaches   . Hypercholesteremia   . Hyperlipidemia   . Hypertension   . Metabolic syndrome X   . Nasal congestion   . Sore throat   . Visual disturbance    Past Surgical History:  Procedure Laterality Date  . ABDOMINAL HYSTERECTOMY  1988  . MASS EXCISION  08/18/11   right buttock   Allergies  Allergen Reactions  . Other Anaphylaxis    Patient has this reaction to grass. 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 Shellfish Allergy Swelling    Of throat Of throat  . Shellfish-Derived Products Swelling    Of throat  . Lisinopril Cough   No current facility-administered medications on file prior to encounter.   Current Outpatient Medications on File Prior to Encounter  Medication Sig Dispense Refill  . amLODipine (NORVASC) 10 MG tablet Take 1 tablet (10 mg total) by mouth daily. 90 tablet 1  . hydrochlorothiazide (HYDRODIURIL) 25 MG  tablet Take 1 tablet (25 mg total) by mouth daily. 90 tablet 1  . orphenadrine (NORFLEX) 100 MG tablet Take 1 tablet (100 mg total) by mouth 2 (two) times daily. 12 tablet 0  . predniSONE (DELTASONE) 10 MG tablet Take 6 tabs day one, 5 tabs day two, 4 tabs day three, etc 21 tablet 0  . triamcinolone (KENALOG) 0.1 % Apply 1 application topically 2 (two) times daily as needed. 60 g 0   Social History   Socioeconomic History  . Marital status: Single    Spouse name: Not on file  . Number of children: Not on file  . Years of education: Not on file  . Highest education level: Not on file  Occupational History  . Not on file  Tobacco Use  . Smoking status: Never Smoker  . Smokeless tobacco: Never Used  Substance and Sexual Activity  . Alcohol use: No  . Drug use: No  . Sexual activity: Not on file  Other Topics Concern  . Not on file  Social History Narrative   Drinks about 3 cups of caffeine daily.   Social Determinants of Health   Financial Resource Strain: Not on file  Food Insecurity: Not on file  Transportation Needs: Not on file  Physical Activity: Not on file  Stress: Not on file  Social Connections: Not on file  Intimate Partner Violence: Not on file   Family History  Problem Relation Age of Onset  . Cancer  Father        lung  . Heart disease Mother     OBJECTIVE: Vitals:   09/03/20 0837  BP: (!) 155/86  Pulse: 72  Resp: 18  Temp: 97.7 F (36.5 C)  TempSrc: Oral  SpO2: 100%    General appearance: alert; no distress Head: NCAT Lungs: clear to auscultation bilaterally Heart: regular rate and rhythm.  Radial pulse 2+ bilaterally Extremities: no edema Skin: warm and dry; (see picture below)    Psychological: alert and cooperative; normal mood and affect  ASSESSMENT & PLAN:  1. Rash     Meds ordered this encounter  Medications  . cefTRIAXone (ROCEPHIN) injection 1 g  . dexamethasone (DECADRON) injection 10 mg  . doxycycline (VIBRAMYCIN) 100 MG  capsule    Sig: Take 1 capsule (100 mg total) by mouth 2 (two) times daily for 7 days.    Dispense:  14 capsule    Refill:  0    Order Specific Question:   Supervising Provider    Answer:   Chase Picket [id number]      Given Rocephin and Decadron IM in office.   Prescribed doxycycline BID for 10 days.   Continue kenalog cream as needed.   Take as prescribed and to completion Avoid hot showers/ baths Moisturize skin daily  Follow up with PCP if symptoms persists Return or go to the ER if you have any new or worsening symptoms such as fever, chills, nausea, vomiting, redness, swelling, discharge, if symptoms do not improve with medications  Reviewed expectations re: course of current medical issues. Questions answered. Outlined signs and symptoms indicating need for more acute intervention. Patient verbalized understanding. After Visit Summary given.   Pearson Forster, NP 09/03/20 [phone]

## 2020-09-16 ENCOUNTER — Ambulatory Visit
Admission: EM | Admit: 2020-09-16 | Discharge: 2020-09-16 | Disposition: A | Discharge status: Home / Self Care | Payer: 59 | Attending: Family Medicine | Admitting: Family Medicine

## 2020-09-16 ENCOUNTER — Encounter (HOSPITAL_BASED_OUTPATIENT_CLINIC_OR_DEPARTMENT_OTHER): Payer: Self-pay | Admitting: *Deleted

## 2020-09-16 ENCOUNTER — Other Ambulatory Visit: Payer: Self-pay

## 2020-09-16 ENCOUNTER — Emergency Department (HOSPITAL_BASED_OUTPATIENT_CLINIC_OR_DEPARTMENT_OTHER)
Admission: EM | Admit: 2020-09-16 | Discharge: 2020-09-16 | Disposition: A | Discharge status: Home / Self Care | Payer: 59 | Attending: Emergency Medicine | Admitting: Emergency Medicine

## 2020-09-16 DIAGNOSIS — I491 Atrial premature depolarization: Secondary | ICD-10-CM

## 2020-09-16 DIAGNOSIS — R002 Palpitations: Secondary | ICD-10-CM

## 2020-09-16 DIAGNOSIS — I10 Essential (primary) hypertension: Secondary | ICD-10-CM | POA: Insufficient documentation

## 2020-09-16 DIAGNOSIS — Z79899 Other long term (current) drug therapy: Secondary | ICD-10-CM | POA: Diagnosis not present

## 2020-09-16 DIAGNOSIS — L299 Pruritus, unspecified: Secondary | ICD-10-CM

## 2020-09-16 DIAGNOSIS — L309 Dermatitis, unspecified: Secondary | ICD-10-CM

## 2020-09-16 DIAGNOSIS — R21 Rash and other nonspecific skin eruption: Secondary | ICD-10-CM | POA: Insufficient documentation

## 2020-09-16 DIAGNOSIS — E876 Hypokalemia: Secondary | ICD-10-CM

## 2020-09-16 DIAGNOSIS — I498 Other specified cardiac arrhythmias: Secondary | ICD-10-CM

## 2020-09-16 DIAGNOSIS — J45909 Unspecified asthma, uncomplicated: Secondary | ICD-10-CM | POA: Insufficient documentation

## 2020-09-16 DIAGNOSIS — E119 Type 2 diabetes mellitus without complications: Secondary | ICD-10-CM | POA: Diagnosis not present

## 2020-09-16 LAB — COMPREHENSIVE METABOLIC PANEL
ALT: 13 U/L (ref 0–44)
AST: 16 U/L (ref 15–41)
Albumin: 4.1 g/dL (ref 3.5–5.0)
Alkaline Phosphatase: 78 U/L (ref 38–126)
Anion gap: 9 (ref 5–15)
BUN: 11 mg/dL (ref 6–20)
CO2: 28 mmol/L (ref 22–32)
Calcium: 9.4 mg/dL (ref 8.9–10.3)
Chloride: 102 mmol/L (ref 98–111)
Creatinine, Ser: 0.73 mg/dL (ref 0.44–1.00)
GFR, Estimated: >60 mL/min (ref >60)
Glucose, Bld: 89 mg/dL (ref 70–99)
Potassium: 3.3 mmol/L — ABNORMAL LOW (ref 3.5–5.1)
Sodium: 139 mmol/L (ref 135–145)
Total Bilirubin: 0.4 mg/dL (ref 0.3–1.2)
Total Protein: 7.3 g/dL (ref 6.5–8.1)

## 2020-09-16 LAB — CBC
HCT: 40.1 % (ref 36.0–46.0)
Hemoglobin: 12.7 g/dL (ref 12.0–15.0)
MCH: 27.5 pg (ref 26.0–34.0)
MCHC: 31.7 g/dL (ref 30.0–36.0)
MCV: 86.8 fL (ref 80.0–100.0)
Platelets: 144 10*3/uL — ABNORMAL LOW (ref 150–400)
RBC: 4.62 MIL/uL (ref 3.87–5.11)
RDW: 13.7 % (ref 11.5–15.5)
WBC: 5.8 10*3/uL (ref 4.0–10.5)
nRBC: 0 % (ref 0.0–0.2)

## 2020-09-16 LAB — TROPONIN I (HIGH SENSITIVITY): Troponin I (High Sensitivity): 5 ng/L (ref <18)

## 2020-09-16 MED ORDER — POTASSIUM CHLORIDE CRYS ER 20 MEQ PO TBCR: 20.0000 meq | EXTENDED_RELEASE_TABLET | Freq: Every day | ORAL | 0 refills | Status: DC

## 2020-09-16 MED ORDER — TRIAMCINOLONE ACETONIDE 0.1 % EX CREA: 1.0000 "application " | TOPICAL_CREAM | Freq: Two times a day (BID) | CUTANEOUS | 0 refills | Status: DC

## 2020-09-16 MED ORDER — POTASSIUM CHLORIDE CRYS ER 20 MEQ PO TBCR
40.0000 meq | EXTENDED_RELEASE_TABLET | Freq: Once | ORAL | Status: AC
  Administered 2020-09-16: 40 meq via ORAL
  Filled 2020-09-16: qty 2

## 2020-09-16 NOTE — ED Provider Notes (Signed)
[facility] DEPT Provider Note   CSN: [id number] Arrival date & time: 09/16/20  1056     History Chief Complaint  Patient presents with  . Irregular Heart Beat    Annette Lindsey is a 60 y.o. female.  Patient c/o sense of palpitations or irregular heart beat in past week. Symptoms acute onset, episodic, recurrent. Went to urgent care today who referred to ED.  Symptoms at rest, no relation to activity or exertion. No associated sob, nv or diaphoresis. No syncope. Denies hx dysrhythmia or cad. No fever or chills. No heavy caffeine/stimulant use. Also notes intermittent rash in past few weeks, seen previously for same. States had started on arms, then trunk - has been treated with antihistamine, steroid, and antibiotics - indicates is much improved, but now notes small lesion to left and right calf area. Prior to onset rash, denies new meds, change in home and/or personal products, or fever/febrile illness. Denies hx same rash. States has been mildly itchy, not painful. No palms or soles. No mm involvement.   The history is provided by the patient.       Past Medical History:  Diagnosis Date  . Asthma   . 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 Bruises easily   . Chest pain   . Constipation   . Diabetes mellitus without complication ([place])   . Generalized headaches   . Hypercholesteremia   . Hyperlipidemia   . Hypertension   . Metabolic syndrome X   . Nasal congestion   . Sore throat   . Visual disturbance     Patient Active Problem List   Diagnosis Date Noted  . 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 Fatigue due to sleep pattern disturbance 10/26/2015  . Morbid obesity due to excess calories ([place]) 10/26/2015  . Nocturia more than twice per night 10/26/2015  . Sepsis ([place]) 03/01/2015  . CAP (community acquired pneumonia) 03/01/2015  . HTN (hypertension) 03/01/2015  . HLD (hyperlipidemia) 03/01/2015  . Diabetes mellitus  without complication ([place]) 22/29/7989  . 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 Obesity, Class III, BMI 40-49.9 (morbid obesity) ([place]) 07/31/2011    Past Surgical History:  Procedure Laterality Date  . ABDOMINAL HYSTERECTOMY  1988  . MASS EXCISION  08/18/11   right buttock     OB History   No obstetric history on file.     Family History  Problem Relation Age of Onset  . Cancer Father        lung  . Heart disease Mother     Social History   Tobacco Use  . Smoking status: Never Smoker  . Smokeless tobacco: Never Used  Substance Use Topics  . Alcohol use: No  . Drug use: No    Home Medications Prior to Admission medications   Medication Sig Start Date End Date Taking? Authorizing Provider  amLODipine (NORVASC) 10 MG tablet Take 1 tablet (10 mg total) by mouth daily. 04/23/20   Isaac Bliss, Rayford Halsted, MD  hydrochlorothiazide (HYDRODIURIL) 25 MG tablet Take 1 tablet (25 mg total) by mouth daily. 04/23/20   Isaac Bliss, Rayford Halsted, MD  orphenadrine (NORFLEX) 100 MG tablet Take 1 tablet (100 mg total) by mouth 2 (two) times daily. 05/07/20   Tacy Learn, PA-C  predniSONE (DELTASONE) 10 MG tablet Take 6 tabs day one, 5 tabs day two, 4 tabs day three, etc 08/29/20   Volney American, PA-C  triamcinolone (KENALOG) 0.1 % Apply 1 application topically 2 (  two) times daily. 09/16/20   Faustino Congress, NP    Allergies    Other, Shellfish allergy, Shellfish-derived products, and Lisinopril  Review of Systems   Review of Systems  Constitutional: Negative for chills and fever.  HENT: Negative for sore throat.   Eyes: Negative for redness.  Respiratory: Negative for cough and shortness of breath.   Cardiovascular: Positive for palpitations. Negative for chest pain.  Gastrointestinal: Negative for abdominal pain, diarrhea and vomiting.  Genitourinary: Negative for dysuria and flank pain.  Musculoskeletal: Negative for back pain and neck pain.  Skin:  Positive for rash.  Neurological: Negative for syncope and headaches.  Hematological: Does not bruise/bleed easily.  Psychiatric/Behavioral: Negative for confusion.    Physical Exam Updated Vital Signs BP (!) 159/78   Pulse 96   Temp 97.8 F (36.6 C) (Oral)   Resp 16   Ht 1.676 m (5\' 6" )   Wt 120.2 kg   SpO2 99%   BMI 42.77 kg/m   Physical Exam Vitals and nursing note reviewed.  Constitutional:      Appearance: Normal appearance. She is well-developed.  HENT:     Head: Atraumatic.     Nose: Nose normal.     Mouth/Throat:     Mouth: Mucous membranes are moist.     Pharynx: Oropharynx is clear.     Comments: No mm lesions. Eyes:     General: No scleral icterus.    Conjunctiva/sclera: Conjunctivae normal.     Pupils: Pupils are equal, round, and reactive to light.  Neck:     Trachea: No tracheal deviation.     Comments: No stiffness or rigidity. Thyroid not grossly enlarged or tender.  Cardiovascular:     Rate and Rhythm: Normal rate and regular rhythm.     Pulses: Normal pulses.     Heart sounds: Normal heart sounds. No murmur heard. No friction rub. No gallop.   Pulmonary:     Effort: Pulmonary effort is normal. No respiratory distress.     Breath sounds: Normal breath sounds.  Abdominal:     General: Bowel sounds are normal. There is no distension.     Palpations: Abdomen is soft.     Tenderness: There is no abdominal tenderness. There is no guarding.  Genitourinary:    Comments: No cva tenderness.  Musculoskeletal:        General: No swelling.     Cervical back: Normal range of motion and neck supple. No rigidity. No muscular tenderness.     Right lower leg: No edema.     Left lower leg: No edema.  Lymphadenopathy:     Cervical: No cervical adenopathy.  Skin:    General: Skin is warm and dry.     Comments: Sparse, faintly erythematous rash to upper chest/breast. Single small clear blister ~ 1 cm diam to left calf without erythema/swelling/tenderness,  single 2-3 mm faintly erythematous lesion to right calf without swelling/tenderness. No lesions to palms or soles.   Neurological:     Mental Status: She is alert.     Comments: Alert, speech normal.   Psychiatric:        Mood and Affect: Mood normal.     ED Results / Procedures / Treatments   Labs (all labs ordered are listed, but only abnormal results are displayed) Results for orders placed or performed during the hospital encounter of 09/16/20  CBC  Result Value Ref Range   WBC 5.8 4.0 - 10.5 K/uL   RBC 4.62 3.87 -  5.11 MIL/uL   Hemoglobin 12.7 12.0 - 15.0 g/dL   HCT 40.1 36.0 - 46.0 %   MCV 86.8 80.0 - 100.0 fL   MCH 27.5 26.0 - 34.0 pg   MCHC 31.7 30.0 - 36.0 g/dL   RDW 13.7 11.5 - 15.5 %   Platelets 144 (L) 150 - 400 K/uL   nRBC 0.0 0.0 - 0.2 %  Comprehensive metabolic panel  Result Value Ref Range   Sodium 139 135 - 145 mmol/L   Potassium 3.3 (L) 3.5 - 5.1 mmol/L   Chloride 102 98 - 111 mmol/L   CO2 28 22 - 32 mmol/L   Glucose, Bld 89 70 - 99 mg/dL   BUN 11 6 - 20 mg/dL   Creatinine, Ser 0.73 0.44 - 1.00 mg/dL   Calcium 9.4 8.9 - 10.3 mg/dL   Total Protein 7.3 6.5 - 8.1 g/dL   Albumin 4.1 3.5 - 5.0 g/dL   AST 16 15 - 41 U/L   ALT 13 0 - 44 U/L   Alkaline Phosphatase 78 38 - 126 U/L   Total Bilirubin 0.4 0.3 - 1.2 mg/dL   GFR, Estimated >60 >60 mL/min   Anion gap 9 5 - 15  Troponin I (High Sensitivity)  Result Value Ref Range   Troponin I (High Sensitivity) 5 <18 ng/L    ED ECG REPORT   Date: 09/16/2020  Rate: 82  Rhythm: normal sinus rhythm  QRS Axis: normal  Intervals: normal  ST/T Wave abnormalities: nonspecific T wave changes  Conduction Disutrbances:none  Narrative Interpretation:   Old EKG Reviewed: no acute st changes  I have personally reviewed the EKG tracing  Radiology No results found.  Procedures Procedures   Medications Ordered in ED Medications - No data to display  ED Course  I have reviewed the triage vital signs and the  nursing notes.  Pertinent labs & imaging results that were available during my care of the patient were reviewed by me and considered in my medical decision making (see chart for details).    MDM Rules/Calculators/A&P                          Labs sent. Continuous pulse ox and cardiac monitoring.   Reviewed nursing notes and prior charts for additional history.   Labs reviewed/interpreted by me - wbc normal. K sl low, kcl po. Trop normal.  Recheck pt, remains in sinus rhythm.  Occasional pac on monitor, no other dysrhythmia noted.  No chest pain or discomfort. No sob or increased wob.   Pt stable for d/c.   Close outpt f/u recommended.   Return precautions provided.      Final Clinical Impression(s) / ED Diagnoses Final diagnoses:  None    Rx / DC Orders ED Discharge Orders    None       Lajean Saver, MD 09/17/20 1300

## 2020-09-16 NOTE — ED Triage Notes (Signed)
PT returns with recurring rash that is now on her legs and breast.

## 2020-09-16 NOTE — Discharge Instructions (Addendum)
EKG today shows sinus arrhythmia, since this is new I would suggest further workup in the ER  I have sent in triamcinolone cream for you to use to the area twice a day as needed.  Referral placed for allergist

## 2020-09-16 NOTE — Discharge Instructions (Addendum)
It was our pleasure to provide your ER care today - we hope that you feel better.  From today's lab tests your potassium level is mildly low  - take potassium supplement as prescribed, eat plenty of fruits and vegetables, and follow up with primary care doctor in 1-2 weeks.   For recurrent skin issues/rash, take benadryl as need, follow up with dermatologist in the next 1-2 weeks - call office to arrange appointment (if difficulty or delay in being seen, you may also try an immediate online telehealth dermatology service such as [facility]).  Return to ER if worse, new symptoms, fevers, worsening rash, persistent fast heart beat, chest pain, trouble breathing, fainting, or other concern.

## 2020-09-16 NOTE — ED Triage Notes (Signed)
Recurring generalized rash x 4-5 days. Reports rash is now on her chest. Pt went to [facility] and was referred to the ED for eval of irregular heart beat. Reports chest tightness with deep inspiration. Denies SOB, denies N/V.

## 2020-09-16 NOTE — ED Provider Notes (Signed)
[facility]   [id number] 09/16/20 Arrival Time: 0913  CC: RASH  SUBJECTIVE:  Annette Lindsey is a 60 y.o. female who presents with a skin complaint that began about 3 weeks ago. Has had ER visit, and multiple urgent care visits for rash and skin issues in the last month. Has been treated for cellulitis x 2 in the last month. Reports that she has been feeling palpitations as well. Reports recent death in her household. Reports rash to L chest, bilateral posterior lower legs. Reports that both arms are still itching. Has not seen PCP for this. Does not see allergist. Denies precipitating event or trauma. Denies changes in soaps, detergents, close contacts with similar rash, known trigger or environmental trigger, allergy. Denies medications change or starting a new medication recently. There are no aggravating or alleviating factors. Denies fever, chills, nausea, vomiting, erythema, swelling, discharge, oral lesions, SOB, abdominal pain, changes in bowel or bladder function.    ROS: As per HPI.  All other pertinent ROS negative.     Past Medical History:  Diagnosis Date  . Asthma   . 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 Bruises easily   . Chest pain   . Constipation   . Diabetes mellitus without complication ([place])   . Generalized headaches   . Hypercholesteremia   . Hyperlipidemia   . Hypertension   . Metabolic syndrome X   . Nasal congestion   . Sore throat   . Visual disturbance    Past Surgical History:  Procedure Laterality Date  . ABDOMINAL HYSTERECTOMY  1988  . MASS EXCISION  08/18/11   right buttock   Allergies  Allergen Reactions  . Other Anaphylaxis    Patient has this reaction to grass. 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 Shellfish Allergy Swelling    Of throat Of throat  . Shellfish-Derived Products Swelling    Of throat  . Lisinopril Cough   No current facility-administered  medications on file prior to encounter.   Current Outpatient Medications on File Prior to Encounter  Medication Sig Dispense Refill  . amLODipine (NORVASC) 10 MG tablet Take 1 tablet (10 mg total) by mouth daily. 90 tablet 1  . hydrochlorothiazide (HYDRODIURIL) 25 MG tablet Take 1 tablet (25 mg total) by mouth daily. 90 tablet 1  . orphenadrine (NORFLEX) 100 MG tablet Take 1 tablet (100 mg total) by mouth 2 (two) times daily. 12 tablet 0  . predniSONE (DELTASONE) 10 MG tablet Take 6 tabs day one, 5 tabs day two, 4 tabs day three, etc 21 tablet 0   Social History   Socioeconomic History  . Marital status: Single    Spouse name: Not on file  . Number of children: Not on file  . Years of education: Not on file  . Highest education level: Not on file  Occupational History  . Not on file  Tobacco Use  . Smoking status: Never Smoker  . Smokeless tobacco: Never Used  Substance and Sexual Activity  . Alcohol use: No  . Drug use: No  . Sexual activity: Not on file  Other Topics Concern  . Not on file  Social History Narrative   Drinks about 3 cups of caffeine daily.   Social Determinants of Health   Financial Resource Strain: Not on file  Food Insecurity: Not on file  Transportation Needs: Not on file  Physical Activity: Not on file  Stress: Not on file  Social Connections: Not on file  Intimate Partner Violence: Not on file   Family History  Problem Relation Age of Onset  . Cancer Father        lung  . Heart disease Mother     OBJECTIVE: Vitals:   09/16/20 0945  BP: (!) 161/94  Pulse: 86  Resp: 20  Temp: 98 F (36.7 C)  SpO2: 97%    General appearance: alert; no distress Head: NCAT Lungs: clear to auscultation bilaterally Heart: regular rate and rhythm.  Radial pulse 2+ bilaterally Extremities: no edema Skin: warm and dry; erythematous papular rash to L chest, bilateral posterior lower extremities Psychological: alert and cooperative; normal mood and  affect  ASSESSMENT & PLAN:  1. Palpitations   2. Pruritus   3. Rash and nonspecific skin eruption   4. Sinus arrhythmia seen on electrocardiogram     Meds ordered this encounter  Medications  . triamcinolone (KENALOG) 0.1 %    Sig: Apply 1 application topically 2 (two) times daily.    Dispense:  45 g    Refill:  0    Order Specific Question:   Supervising Provider    Answer:   Chase Picket [[id number]]    Take zyrtec daily Refilled triamcinolone cream Ambulatory referral placed for allergist Take as prescribed and to completion Avoid hot showers/ baths Moisturize skin daily   EKG today shows sinus arrythmia Since this is new for her and she is symptomatic, discussed that she may need cardiac workup in the ER Patient verbalized understanding and agreement To ER via POV   Faustino Congress, NP 09/16/20 1122

## 2020-09-27 ENCOUNTER — Other Ambulatory Visit: Payer: Self-pay

## 2020-09-28 ENCOUNTER — Encounter: Payer: Self-pay | Admitting: Internal Medicine

## 2020-09-28 ENCOUNTER — Ambulatory Visit (INDEPENDENT_AMBULATORY_CARE_PROVIDER_SITE_OTHER): Payer: 59 | Admitting: Internal Medicine

## 2020-09-28 VITALS — BP 130/80 | HR 75 | Temp 98.0°F | Wt 272.1 lb

## 2020-09-28 DIAGNOSIS — E66813 Obesity, class 3: Secondary | ICD-10-CM

## 2020-09-28 DIAGNOSIS — E785 Hyperlipidemia, unspecified: Secondary | ICD-10-CM | POA: Diagnosis not present

## 2020-09-28 DIAGNOSIS — Z1211 Encounter for screening for malignant neoplasm of colon: Secondary | ICD-10-CM | POA: Diagnosis not present

## 2020-09-28 DIAGNOSIS — I10 Essential (primary) hypertension: Secondary | ICD-10-CM | POA: Diagnosis not present

## 2020-09-28 DIAGNOSIS — E119 Type 2 diabetes mellitus without complications: Secondary | ICD-10-CM | POA: Diagnosis not present

## 2020-09-28 DIAGNOSIS — R21 Rash and other nonspecific skin eruption: Secondary | ICD-10-CM

## 2020-09-28 LAB — POCT GLYCOSYLATED HEMOGLOBIN (HGB A1C): Hemoglobin A1C: 6.2 % — AB (ref 4.0–5.6)

## 2020-09-28 NOTE — Progress Notes (Signed)
Established Patient Office Visit     This visit occurred during the SARS-CoV-2 public health emergency.  Safety protocols were in place, including screening questions prior to the visit, additional usage of staff PPE, and extensive cleaning of exam room while observing appropriate contact time as indicated for disinfecting solutions.    CC/Reason for Visit: ED follow-up and follow-up chronic medical conditions  HPI: Annette Lindsey is a 60 y.o. female who is coming in today for the above mentioned reasons. Past Medical History is significant for: Type 2 diabetes that has been well controlled, hypertension, morbid obesity.  Since February she has been dealing with a rash.  She has visited urgent care in the ED several times.  Area will start as a large, erythematous raised rash that will then progress to a large blister before it resolves.  She has been prescribed 2 courses of antibiotics including Bactrim and doxycycline, she was given IM Rocephin at some point, she has been given IM steroids as well as prednisone taper and steroid topical creams.  The rash is mostly improved at this point.  An appointment was made for her to see an allergist and this is next week.  Other than that she has been feeling well.   Past Medical/Surgical History: Past Medical History:  Diagnosis Date  . Asthma   . 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 Bruises easily   . Chest pain   . Constipation   . Diabetes mellitus without complication ([place])   . Generalized headaches   . Hypercholesteremia   . Hyperlipidemia   . Hypertension   . Metabolic syndrome X   . Nasal congestion   . Sore throat   . Visual disturbance     Past Surgical History:  Procedure Laterality Date  . ABDOMINAL HYSTERECTOMY  1988  . MASS EXCISION  08/18/11   right buttock    Social History:  reports that she has never smoked. She has never used smokeless tobacco. She reports that she does not drink alcohol  and does not use drugs.  Allergies: Allergies  Allergen Reactions  . Other Anaphylaxis    Patient has this reaction to grass. 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 Shellfish Allergy Swelling    Of throat Of throat  . Shellfish-Derived Products Swelling    Of throat  . Lisinopril Cough    Family History:  Family History  Problem Relation Age of Onset  . Cancer Father        lung  . Heart disease Mother      Current Outpatient Medications:  .  amLODipine (NORVASC) 10 MG tablet, Take 1 tablet (10 mg total) by mouth daily., Disp: 90 tablet, Rfl: 1 .  hydrochlorothiazide (HYDRODIURIL) 25 MG tablet, Take 1 tablet (25 mg total) by mouth daily., Disp: 90 tablet, Rfl: 1 .  orphenadrine (NORFLEX) 100 MG tablet, Take 1 tablet (100 mg total) by mouth 2 (two) times daily., Disp: 12 tablet, Rfl: 0 .  potassium chloride SA (KLOR-CON) 20 MEQ tablet, Take 1 tablet (20 mEq total) by mouth daily., Disp: 10 tablet, Rfl: 0 .  triamcinolone (KENALOG) 0.1 %, Apply 1 application topically 2 (two) times daily., Disp: 45 g, Rfl: 0  Review of Systems:  Constitutional: Denies fever, chills, diaphoresis, appetite change and fatigue.  HEENT: Denies photophobia, eye pain, redness, hearing loss, ear pain, congestion, sore throat, rhinorrhea, sneezing, mouth sores, trouble swallowing, neck pain, neck  stiffness and tinnitus.   Respiratory: Denies SOB, DOE, cough, chest tightness,  and wheezing.   Cardiovascular: Denies chest pain, palpitations and leg swelling.  Gastrointestinal: Denies nausea, vomiting, abdominal pain, diarrhea, constipation, blood in stool and abdominal distention.  Genitourinary: Denies dysuria, urgency, frequency, hematuria, flank pain and difficulty urinating.  Endocrine: Denies: hot or cold intolerance, sweats, changes in hair or nails, polyuria, polydipsia. Musculoskeletal: Denies myalgias, back pain, joint swelling, arthralgias and gait  problem.  Skin: Denies pallor, rash and wound.  Neurological: Denies dizziness, seizures, syncope, weakness, light-headedness, numbness and headaches.  Hematological: Denies adenopathy. Easy bruising, personal or family bleeding history  Psychiatric/Behavioral: Denies suicidal ideation, mood changes, confusion, nervousness, sleep disturbance and agitation    Physical Exam: Vitals:   09/28/20 1103  BP: 130/80  Pulse: 75  Temp: 98 F (36.7 C)  TempSrc: Oral  SpO2: 97%  Weight: 272 lb 1.6 oz (123.4 kg)    Body mass index is 43.92 kg/m.   Constitutional: NAD, calm, comfortable Eyes: PERRL, lids and conjunctivae normal ENMT: Mucous membranes are moist. Respiratory: clear to auscultation bilaterally, no wheezing, no crackles. Normal respiratory effort. No accessory muscle use.  Cardiovascular: Regular rate and rhythm, no murmurs / rubs / gallops. No extremity edema. 2 Neurologic: Grossly intact and nonfocal Psychiatric: Normal judgment and insight. Alert and oriented x 3. Normal mood.    Impression and Plan:  Diabetes mellitus without complication (HCC)  -J4G today shows good control at 6.2, she is not on medication.  Screening for malignant neoplasm of colon  - Plan: Ambulatory referral to Gastroenterology  Primary hypertension -Currently controlled on hydrochlorothiazide and amlodipine.  Hyperlipidemia, unspecified hyperlipidemia type -Last LDL was 205 but this was back in 2017, she is not on a statin. -Need to check fasting lipids when she returns.  Obesity, Class III, BMI 40-49.9 (morbid obesity) ([place]) -Discussed healthy lifestyle, including increased physical activity and better food choices to promote weight loss.  Rash -Mostly resolved at this point. -Seeing allergy next week, if recurs can also consider dermatology referral.    Lelon Frohlich, MD [facility]

## 2020-10-18 NOTE — Progress Notes (Signed)
New Patient Note  RE: Annette Lindsey MRN: [id number] DOB: 31-May-1961 Date of Office Visit: 10/19/2020  Consult requested by: Isaac Bliss, [place] [facility]* Primary care provider: Isaac Bliss, Rayford Halsted, MD  Chief Complaint: Urticaria  History of Present Illness: I had the pleasure of seeing Annette Lindsey for initial evaluation at the [facility] on 10/19/2020. She is a 60 y.o. female, who is referred here by Isaac Bliss, Rayford Halsted, MD for the evaluation of rash.  Rash: Rash started about 2 months ago. This initially started as a blistered filled bump on her right arm which then led to swelling and erythema on that arm. Similar blister on the left arm then lower extremities b/l.  Mainly occurs on her extremities. Describes them as red, swollen, painful, itchy. She also had 1 blister on her extremities associated with this with some red streaks. Symptoms resolved after 2 weeks after taking antibiotics and prednisone. No ecchymosis upon resolution. Associated symptoms include: possible shortness of breath. Suspected triggers are unknown. Denies any fevers, chills, changes in medications, foods, personal care products or recent infections. She has tried the following therapies: antibiotics, prednisone, benadryl with some benefit. Systemic steroids yes. Currently on no medications.  Previous work up includes: 2022 - cbc and cmp (Low K and borderline low plt) Previous history of rash/hives: when exposed to grass only.  Patient is up to date with the following cancer screening tests: physical exam. Not up to date with mammogram or pap smears.  09/16/2020 UC visit: "Annette Lindsey is a 60 y.o. female who presents with a skin complaint that began about 3 weeks ago. Has had ER visit, and multiple urgent care visits for rash and skin issues in the last month. Has been treated for cellulitis x 2 in the last month. Reports that she has been feeling palpitations as  well. Reports recent death in her household. Reports rash to L chest, bilateral posterior lower legs. Reports that both arms are still itching. Has not seen PCP for this. Does not see allergist. Denies precipitating event or trauma. Denies changes in soaps, detergents, close contacts with similar rash, known trigger or environmental trigger, allergy. Denies medications change or starting a new medication recently. There are no aggravating or alleviating factors. Denies fever, chills, nausea, vomiting, erythema, swelling, discharge, oral lesions, SOB, abdominal pain, changes in bowel or bladder function."  Assessment and Plan: Annette Lindsey is a 60 y.o. female with: Rash and other nonspecific skin eruption 4 episodes of fluid filled blister on the extremities which then lead to erythema and swelling requiring antibiotics and prednisone. No triggers noted. Denies any insect/bug infestations at home. She has 2 dogs at home  - unsure of tick/flee issues.   Based on clinical history not sure what cause these episodes but distribution concerning whether it was triggered by some type of insect/bug bite.  Take zyrtec 10mg  daily to help the itching.   See below for proper skin care.   Take pictures.   Check your dogs to make sure they don't have any fleas/ticks.  Check your home as well.  Get bloodwork to rule out other etiologies.  Other allergic rhinitis Worse symptoms as a child and used be on AIT. Grass still causes hives.  Today's skin testing showed: Positive to grass, weed pollen, trees, dust mites. Borderline to mold. Negative to fire ant.  Start environmental control measures as below.  May use over the counter antihistamines such as Zyrtec (cetirizine) 10mg  daily as above.  Adverse reaction to food, subsequent encounter Reaction to shellfish in the past. No prior work up. Avoiding all seafood.  Today's skin testing showed: Positive to shellfish. Negative to finned fish.  Continue strict  avoidance of shellfish and seafood.  I have prescribed epinephrine injectable and demonstrated proper use. For mild symptoms you can take over the counter antihistamines such as Benadryl and monitor symptoms closely. If symptoms worsen or if you have severe symptoms including breathing issues, throat closure, significant swelling, whole body hives, severe diarrhea and vomiting, lightheadedness then inject epinephrine and seek immediate medical care afterwards.  Food action plan given.  Get bloodwork for finned fish - if negative will discuss how to introduce it back into your diet.  History of bee sting allergy As a teenager. No prior work up.  Continue avoidance.  Get bloodwork in future.  History of asthma As a child but no inhaler use for many years. Denies any symptoms.  Monitor.  Get spirometry if symptoms return.  Return in about 2 months (around 12/19/2020).  Meds ordered this encounter  Medications  . EPINEPHrine 0.3 mg/0.3 mL IJ SOAJ injection    Sig: Inject 0.3 mg into the muscle as needed for anaphylaxis.    Dispense:  1 each    Refill:  2    May dispense generic/Mylan/Teva brand.  . cetirizine (ZYRTEC ALLERGY) 10 MG tablet    Sig: Take 1 tablet (10 mg total) by mouth daily.    Dispense:  30 tablet    Refill:  5    Lab Orders     CBC with Differential/Platelet     Chronic Urticaria     Comprehensive metabolic panel     C-reactive protein     Tryptase     Sedimentation rate     Allergens w/Total IgE Area 2     Alpha-Gal Panel     ANA w/Reflex     Allergen Profile, Food-Fish     C1 Esterase Inhibitor     C1 esterase inhibitor, functional     C3 and C4     Thyroid Cascade Profile  Other allergy screening: Asthma: yes  As a child and no issues recently and no inhaler use for many years.   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  Grass triggers symptoms.  Food allergy: yes  Throat swelling with  seafood. No prior work up. No Epipen at home.   Medication allergy: yes Hymenoptera allergy: got stung as a teenager and had to go to the hospital.  History of recurrent infections suggestive of immunodeficency: no  Diagnostics: Skin Testing: Environmental allergy panel and select foods. Positive to grass, weed pollen, trees, dust mites. Borderline to mold.  Positive to shellfish.  Negative to finned fish and fire ant.  Results discussed with patient/family.  Airborne Adult Perc - 10/19/20 0954    Time Antigen Placed 5409    Allergen Manufacturer Lavella Hammock    Location Back    Number of Test 59    1. Control-Buffer 50% Glycerol Negative    2. Control-Histamine 1 mg/ml 2+    3. Albumin saline Negative    4. [place] Negative    5. [place] Negative    6. Johnson Negative    7. Kentucky Blue 2+    8. Meadow Fescue 2+    9. Perennial Rye Negative    10. Sweet Vernal Negative    11. Timothy Negative    12.  Cocklebur Negative    13. Burweed Marshelder Negative    14. Ragweed, short Negative    15. Ragweed, Giant Negative    16. Plantain,  English Negative    17. Lamb's Quarters Negative    18. Sheep Sorrell 2+    19. Rough Pigweed Negative    20. Marsh Elder, Rough Negative    21. Mugwort, Common Negative    22. Ash mix 2+    23. Birch mix Negative    24. Beech American Negative    25. Box, Elder --   +/-   31. Cedar, red --   +/-   27. Cottonwood, Eastern Negative    28. Elm mix Negative    29. Hickory 2+    30. Maple mix 2+    31. Oak, [place] mix Negative    32. Pecan Pollen Negative    33. Pine mix Negative    34. Sycamore Eastern Negative    35. England, Black Pollen Negative    36. Alternaria alternata Negative   +/-   37. Cladosporium Herbarum Negative    38. Aspergillus mix Negative    39. Penicillium mix Negative    40. Bipolaris sorokiniana (Helminthosporium) Negative    41. Drechslera spicifera (Curvularia) Negative    42. Mucor plumbeus Negative    43.  Fusarium moniliforme Negative    44. Aureobasidium pullulans (pullulara) Negative    45. Rhizopus oryzae Negative    46. Botrytis cinera Negative    47. Epicoccum nigrum Negative    48. Phoma betae Negative    49. Candida Albicans Negative    50. Trichophyton mentagrophytes Negative    51. Mite, D Farinae  5,000 AU/ml 3+    52. Mite, D Pteronyssinus  5,000 AU/ml 2+    53. Cat Hair 10,000 BAU/ml Negative    54.  Dog Epithelia Negative    55. Mixed Feathers Negative    56. Horse Epithelia Negative    57. Cockroach, German Negative    58. Mouse Negative    59. Tobacco Leaf Negative          Food Adult Perc - 10/19/20 0900    Time Antigen Placed 4128    Allergen Manufacturer Lavella Hammock    Location Back    Number of allergen test 15    8. Shellfish Mix --   1x5   9. Fish Mix Negative    18. Catfish Negative    19. Bass Negative    20. Trout Negative    21. Tuna Negative    22. Salmon Negative    23. Flounder Negative    24. Codfish Negative    25. Shrimp --   5x5   26. Crab --   10x6   27. Lobster --   8x5   28. Oyster Negative    29. Scallops Negative    6. Other Negative           Past Medical History: Patient Active Problem List   Diagnosis Date Noted  . Rash and other nonspecific skin eruption 10/19/2020  . Pruritus 10/19/2020  . Extremity edema 10/19/2020  . Other allergic rhinitis 10/19/2020  . Adverse reaction to food, subsequent encounter 10/19/2020  . History of bee sting allergy 10/19/2020  . History of asthma 10/19/2020  . 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 Fatigue due to sleep pattern disturbance 10/26/2015  . Morbid obesity due to excess calories ([place]) 10/26/2015  . Nocturia more than twice per night 10/26/2015  .  Sepsis ([place]) 03/01/2015  . CAP (community acquired pneumonia) 03/01/2015  . HTN (hypertension) 03/01/2015  . HLD (hyperlipidemia) 03/01/2015  . Diabetes mellitus without complication ([place]) 88/50/2774  . 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 Obesity, Class III, BMI 40-49.9 (morbid obesity) ([place]) 07/31/2011   Past Medical History:  Diagnosis Date  . Asthma   . 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 Bruises easily   . Chest pain   . Constipation   . Diabetes mellitus without complication ([place])   . Generalized headaches   . Hypercholesteremia   . Hyperlipidemia   . Hypertension   . Metabolic syndrome X   . Nasal congestion   . Sore throat   . Visual disturbance    Past Surgical History: Past Surgical History:  Procedure Laterality Date  . ABDOMINAL HYSTERECTOMY  1988  . MASS EXCISION  08/18/11   right buttock   Medication List:  Current Outpatient Medications  Medication Sig Dispense Refill  . amLODipine (NORVASC) 10 MG tablet Take 1 tablet (10 mg total) by mouth daily. 90 tablet 1  . cetirizine (ZYRTEC ALLERGY) 10 MG tablet Take 1 tablet (10 mg total) by mouth daily. 30 tablet 5  . EPINEPHrine 0.3 mg/0.3 mL IJ SOAJ injection Inject 0.3 mg into the muscle as needed for anaphylaxis. 1 each 2  . hydrochlorothiazide (HYDRODIURIL) 25 MG tablet Take 1 tablet (25 mg total) by mouth daily. 90 tablet 1  . orphenadrine (NORFLEX) 100 MG tablet Take 1 tablet (100 mg total) by mouth 2 (two) times daily. 12 tablet 0  . potassium chloride SA (KLOR-CON) 20 MEQ tablet Take 1 tablet (20 mEq total) by mouth daily. 10 tablet 0  . triamcinolone (KENALOG) 0.1 % Apply 1 application topically 2 (two) times daily. 45 g 0   No current facility-administered medications for this visit.   Allergies: Allergies  Allergen Reactions  . Other Anaphylaxis    Patient has this reaction to grass. 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 Shellfish Allergy Swelling    Of throat Of throat  . Shellfish-Derived Products Swelling    Of throat  . Lisinopril Cough   Social History: Social History   Socioeconomic History  . Marital status: Single    Spouse name: Not on  file  . Number of children: Not on file  . Years of education: Not on file  . Highest education level: Not on file  Occupational History  . Not on file  Tobacco Use  . Smoking status: Never Smoker  . Smokeless tobacco: Never Used  Substance and Sexual Activity  . Alcohol use: No  . Drug use: No  . Sexual activity: Not on file  Other Topics Concern  . Not on file  Social History Narrative   Drinks about 3 cups of caffeine daily.   Social Determinants of Health   Financial Resource Strain: Not on file  Food Insecurity: Not on file  Transportation Needs: Not on file  Physical Activity: Not on file  Stress: Not on file  Social Connections: Not on file   Lives in a house built in 1987. 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   Family History: Family History  Problem Relation Age of Onset  .  Cancer Father        lung  . Heart disease Mother    Problem                               Relation Asthma                                   No  Eczema                                No Food allergy                          No   Allergic rhino conjunctivitis     No   Review of Systems  Constitutional: Negative for appetite change, chills, fever and unexpected weight change.  HENT: Negative for congestion and rhinorrhea.   Eyes: Negative for itching.  Respiratory: Negative for cough, chest tightness, shortness of breath and wheezing.   Cardiovascular: Negative for chest pain.  Gastrointestinal: Negative for abdominal pain.  Genitourinary: Negative for difficulty urinating.  Skin: Positive for rash.  Allergic/Immunologic: Positive for environmental allergies and food allergies.  Neurological: Negative for headaches.   Objective: BP (!)  148/80 (BP Location: Left Arm, Patient Position: Sitting, Cuff Size: Large)   Pulse 60   Temp 97.8 F (36.6 C) (Temporal)   Resp 18   Ht 5\' 6"  (1.676 m)   Wt 272 lb 3.2 oz (123.5 kg)   SpO2 96%   BMI 43.93 kg/m  Body mass index is 43.93 kg/m. Physical Exam Vitals and nursing note reviewed.  Constitutional:      Appearance: Normal appearance. She is well-developed.  HENT:     Head: Normocephalic and atraumatic.     Right Ear: Tympanic membrane and external ear normal.     Left Ear: Tympanic membrane and external ear normal.     Nose: Nose normal.     Mouth/Throat:     Mouth: Mucous membranes are moist.     Pharynx: Oropharynx is clear.  Eyes:     Conjunctiva/sclera: Conjunctivae normal.  Cardiovascular:     Rate and Rhythm: Normal rate and regular rhythm.     Heart sounds: Normal heart sounds. No murmur heard. No friction rub. No gallop.   Pulmonary:     Effort: Pulmonary effort is normal.     Breath sounds: Normal breath sounds. No wheezing, rhonchi or rales.  Musculoskeletal:     Cervical back: Neck supple.  Skin:    General: Skin is warm and dry.     Comments: Dry skin. Hyperpigmented mark where blisters were on the extremities. A few papular erythematous rash on the left wrist area.  Neurological:     Mental Status: She is alert and oriented to person, place, and time.  Psychiatric:        Behavior: Behavior normal.    The plan was reviewed with the patient/family, and all questions/concerned were addressed.  It was my pleasure to see Annette Lindsey today and participate in her care. Please feel free to contact me with any questions or concerns.  Sincerely,  Rexene Alberts, DO Allergy & Immunology  [facility] office: [place] office: [phone]

## 2020-10-19 ENCOUNTER — Ambulatory Visit (INDEPENDENT_AMBULATORY_CARE_PROVIDER_SITE_OTHER): Payer: 59 | Admitting: Allergy

## 2020-10-19 ENCOUNTER — Encounter: Payer: Self-pay | Admitting: Allergy

## 2020-10-19 ENCOUNTER — Other Ambulatory Visit: Payer: Self-pay

## 2020-10-19 VITALS — BP 148/80 | HR 60 | Temp 97.8°F | Resp 18 | Ht 66.0 in | Wt 272.2 lb

## 2020-10-19 DIAGNOSIS — R21 Rash and other nonspecific skin eruption: Secondary | ICD-10-CM | POA: Diagnosis not present

## 2020-10-19 DIAGNOSIS — T781XXD Other adverse food reactions, not elsewhere classified, subsequent encounter: Secondary | ICD-10-CM

## 2020-10-19 DIAGNOSIS — R600 Localized edema: Secondary | ICD-10-CM

## 2020-10-19 DIAGNOSIS — J3089 Other allergic rhinitis: Secondary | ICD-10-CM

## 2020-10-19 DIAGNOSIS — L299 Pruritus, unspecified: Secondary | ICD-10-CM | POA: Insufficient documentation

## 2020-10-19 DIAGNOSIS — Z91030 Bee allergy status: Secondary | ICD-10-CM | POA: Diagnosis not present

## 2020-10-19 DIAGNOSIS — Z8709 Personal history of other diseases of the respiratory system: Secondary | ICD-10-CM

## 2020-10-19 MED ORDER — CETIRIZINE HCL 10 MG PO TABS: 10.0000 mg | ORAL_TABLET | Freq: Every day | ORAL | 5 refills | Status: DC

## 2020-10-19 MED ORDER — EPINEPHRINE 0.3 MG/0.3ML IJ SOAJ: 0.3000 mg | INTRAMUSCULAR | 2 refills | Status: AC | PRN

## 2020-10-19 NOTE — Assessment & Plan Note (Addendum)
Reaction to shellfish in the past. No prior work up. Avoiding all seafood.  Today's skin testing showed: Positive to shellfish. Negative to finned fish.  Continue strict avoidance of shellfish and seafood.  I have prescribed epinephrine injectable and demonstrated proper use. For mild symptoms you can take over the counter antihistamines such as Benadryl and monitor symptoms closely. If symptoms worsen or if you have severe symptoms including breathing issues, throat closure, significant swelling, whole body hives, severe diarrhea and vomiting, lightheadedness then inject epinephrine and seek immediate medical care afterwards.  Food action plan given.  Get bloodwork for finned fish - if negative will discuss how to introduce it back into your diet.

## 2020-10-19 NOTE — Patient Instructions (Addendum)
Today's skin testing showed: Positive to grass, weed pollen, trees, dust mites. Borderline to mold.   Positive to shellfish.  Negative to finned fish and fire ant.   Rash/itching:  Not sure what caused the rash/swelling.  Take zyrtec 10mg  daily to help the itching.   See below for proper skin care.   Take pictures.   Check your dogs to make sure they don't have any fleas/ticks.  Check your home as well.  Get bloodwork:  We are ordering labs, so please allow 1-2 weeks for the results to come back. With the newly implemented Cures Act, the labs might be visible to you at the same time that they become visible to me. However, I will not address the results until all of the results are back, so please be patient.  In the meantime, continue recommendations in your patient instructions, including avoidance measures (if applicable), until you hear from me.  Environmental allergies  Start environmental control measures as below.  May use over the counter antihistamines such as Zyrtec (cetirizine) 10mg  daily as above.  Food:  Continue strict avoidance of shellfish and seafood.  I have prescribed epinephrine injectable and demonstrated proper use. For mild symptoms you can take over the counter antihistamines such as Benadryl and monitor symptoms closely. If symptoms worsen or if you have severe symptoms including breathing issues, throat closure, significant swelling, whole body hives, severe diarrhea and vomiting, lightheadedness then inject epinephrine and seek immediate medical care afterwards.  Food action plan given.  Get bloodwork for finned fish - if negative will discuss how to introduce it back into your diet.  Follow up in 2 months or sooner if needed.   Reducing Pollen Exposure . Pollen seasons: trees (spring), grass (summer) and ragweed/weeds (fall). Marland Kitchen Keep windows closed in your home and car to lower pollen exposure.  Susa Simmonds air conditioning in the bedroom and  throughout the house if possible.  . Avoid going out in dry windy days - especially early morning. . Pollen counts are highest between 5 - 10 AM and on dry, hot and windy days.  . Save outside activities for late afternoon or after a heavy rain, when pollen levels are lower.  . Avoid mowing of grass if you have grass pollen allergy. Marland Kitchen Be aware that pollen can also be transported indoors on people and pets.  . Dry your clothes in an automatic dryer rather than hanging them outside where they might collect pollen.  . Rinse hair and eyes before bedtime. Control of House Dust Mite Allergen . Dust mite allergens are a common trigger of allergy and asthma symptoms. While they can be found throughout the house, these microscopic creatures thrive in warm, humid environments such as bedding, upholstered furniture and carpeting. . Because so much time is spent in the bedroom, it is essential to reduce mite levels there.  . Encase pillows, mattresses, and box springs in special allergen-proof fabric covers or airtight, zippered plastic covers.  . Bedding should be washed weekly in hot water (130 F) and dried in a hot dryer. Allergen-proof covers are available for comforters and pillows that can't be regularly washed.  Wendee Copp the allergy-proof covers every few months. Minimize clutter in the bedroom. Keep pets out of the bedroom.  Marland Kitchen Keep humidity less than 50% by using a dehumidifier or air conditioning. You can buy a humidity measuring device called a hygrometer to monitor this.  . If possible, replace carpets with hardwood, linoleum, or washable area rugs.  If that's not possible, vacuum frequently with a vacuum that has a HEPA filter. . Remove all upholstered furniture and non-washable window drapes from the bedroom. . Remove all non-washable stuffed toys from the bedroom.  Wash stuffed toys weekly. Mold Control . Mold and fungi can grow on a variety of surfaces provided certain temperature and moisture  conditions exist.  . Outdoor molds grow on plants, decaying vegetation and soil. The major outdoor mold, Alternaria and Cladosporium, are found in very high numbers during hot and dry conditions. Generally, a late summer - fall peak is seen for common outdoor fungal spores. Rain will temporarily lower outdoor mold spore count, but counts rise rapidly when the rainy period ends. . The most important indoor molds are Aspergillus and Penicillium. Dark, humid and poorly ventilated basements are ideal sites for mold growth. The next most common sites of mold growth are the bathroom and the kitchen. Outdoor (Seasonal) Mold Control . Use air conditioning and keep windows closed. . Avoid exposure to decaying vegetation. Marland Kitchen Avoid leaf raking. . Avoid grain handling. . Consider wearing a face mask if working in moldy areas.  Indoor (Perennial) Mold Control  . Maintain humidity below 50%. . Get rid of mold growth on hard surfaces with water, detergent and, if necessary, 5% bleach (do not mix with other cleaners). Then dry the area completely. If mold covers an area more than 10 square feet, consider hiring an indoor environmental professional. . For clothing, washing with soap and water is best. If moldy items cannot be cleaned and dried, throw them away. . Remove sources e.g. contaminated carpets. . Repair and seal leaking roofs or pipes. Using dehumidifiers in damp basements may be helpful, but empty the water and clean units regularly to prevent mildew from forming. All rooms, especially basements, bathrooms and kitchens, require ventilation and cleaning to deter mold and mildew growth. Avoid carpeting on concrete or damp floors, and storing items in damp areas.   Skin care recommendations  Bath time: . Always use lukewarm water. AVOID very hot or cold water. Marland Kitchen Keep bathing time to 5-10 minutes. . Do NOT use bubble bath. . Use a mild soap and use just enough to wash the dirty areas. . Do NOT scrub  skin vigorously.  . After bathing, pat dry your skin with a towel. Do NOT rub or scrub the skin.  Moisturizers and prescriptions:  . ALWAYS apply moisturizers immediately after bathing (within 3 minutes). This helps to lock-in moisture. . Use the moisturizer several times a day over the whole body. Kermit Balo summer moisturizers include: Aveeno, CeraVe, Cetaphil. Kermit Balo winter moisturizers include: Aquaphor, Vaseline, Cerave, Cetaphil, Eucerin, Vanicream. . When using moisturizers along with medications, the moisturizer should be applied about one hour after applying the medication to prevent diluting effect of the medication or moisturize around where you applied the medications. When not using medications, the moisturizer can be continued twice daily as maintenance.  Laundry and clothing: . Avoid laundry products with added color or perfumes. . Use unscented hypo-allergenic laundry products such as Tide free, Cheer free & gentle, and All free and clear.  . If the skin still seems dry or sensitive, you can try double-rinsing the clothes. . Avoid tight or scratchy clothing such as wool. . Do not use fabric softeners or dyer sheets.

## 2020-10-19 NOTE — Assessment & Plan Note (Addendum)
Worse symptoms as a child and used be on AIT. Grass still causes hives.  Today's skin testing showed: Positive to grass, weed pollen, trees, dust mites. Borderline to mold. Negative to fire ant.  Start environmental control measures as below.  May use over the counter antihistamines such as Zyrtec (cetirizine) 10mg  daily as above.

## 2020-10-19 NOTE — Assessment & Plan Note (Signed)
As a teenager. No prior work up.  Continue avoidance.  Get bloodwork in future.

## 2020-10-19 NOTE — Assessment & Plan Note (Signed)
As a child but no inhaler use for many years. Denies any symptoms.  Monitor.  Get spirometry if symptoms return.

## 2020-10-19 NOTE — Assessment & Plan Note (Signed)
4 episodes of fluid filled blister on the extremities which then lead to erythema and swelling requiring antibiotics and prednisone. No triggers noted. Denies any insect/bug infestations at home. She has 2 dogs at home  - unsure of tick/flee issues.   Based on clinical history not sure what cause these episodes but distribution concerning whether it was triggered by some type of insect/bug bite.  Take zyrtec 10mg  daily to help the itching.   See below for proper skin care.   Take pictures.   Check your dogs to make sure they don't have any fleas/ticks.  Check your home as well.  Get bloodwork to rule out other etiologies.

## 2020-10-19 NOTE — Addendum Note (Signed)
Addended by: Jaynie Crumble on: 10/19/2020 03:55 PM   Modules accepted: Orders

## 2020-10-28 LAB — CBC WITH DIFFERENTIAL/PLATELET
Basophils Absolute: 0 10*3/uL (ref 0.0–0.2)
Basos: 1 %
EOS (ABSOLUTE): 0.1 10*3/uL (ref 0.0–0.4)
Eos: 2 %
Hematocrit: 42.6 % (ref 34.0–46.6)
Hemoglobin: 13.5 g/dL (ref 11.1–15.9)
Immature Grans (Abs): 0 10*3/uL (ref 0.0–0.1)
Immature Granulocytes: 0 %
Lymphocytes Absolute: 2.2 10*3/uL (ref 0.7–3.1)
Lymphs: 47 %
MCH: 27.3 pg (ref 26.6–33.0)
MCHC: 31.7 g/dL (ref 31.5–35.7)
MCV: 86 fL (ref 79–97)
Monocytes Absolute: 0.3 10*3/uL (ref 0.1–0.9)
Monocytes: 6 %
Neutrophils Absolute: 2.1 10*3/uL (ref 1.4–7.0)
Neutrophils: 44 %
Platelets: 224 10*3/uL (ref 150–450)
RBC: 4.94 x10E6/uL (ref 3.77–5.28)
RDW: 14.2 % (ref 11.7–15.4)
WBC: 4.7 10*3/uL (ref 3.4–10.8)

## 2020-10-28 LAB — C-REACTIVE PROTEIN: CRP: 7 mg/L (ref 0–10)

## 2020-10-28 LAB — COMPREHENSIVE METABOLIC PANEL
ALT: 16 IU/L (ref 0–32)
AST: 17 IU/L (ref 0–40)
Albumin/Globulin Ratio: 1.2 (ref 1.2–2.2)
Albumin: 4.1 g/dL (ref 3.8–4.9)
Alkaline Phosphatase: 92 IU/L (ref 44–121)
BUN/Creatinine Ratio: 12 (ref 9–23)
BUN: 9 mg/dL (ref 6–24)
Bilirubin Total: 0.3 mg/dL (ref 0.0–1.2)
CO2: 24 mmol/L (ref 20–29)
Calcium: 9.2 mg/dL (ref 8.7–10.2)
Chloride: 101 mmol/L (ref 96–106)
Creatinine, Ser: 0.77 mg/dL (ref 0.57–1.00)
Globulin, Total: 3.4 g/dL (ref 1.5–4.5)
Glucose: 95 mg/dL (ref 65–99)
Potassium: 3.7 mmol/L (ref 3.5–5.2)
Sodium: 141 mmol/L (ref 134–144)
Total Protein: 7.5 g/dL (ref 6.0–8.5)
eGFR: 90 mL/min/{1.73_m2} (ref >59)

## 2020-10-28 LAB — ALLERGENS W/TOTAL IGE AREA 2
Alternaria Alternata IgE: <0.1 kU/L
Aspergillus Fumigatus IgE: 0.1 kU/L — AB
Bermuda Grass IgE: <0.1 kU/L
Cat Dander IgE: 0.23 kU/L — AB
Cedar, Mountain IgE: 0.77 kU/L — AB
Cladosporium Herbarum IgE: <0.1 kU/L
Cockroach, German IgE: 7.95 kU/L — AB
Common Silver Birch IgE: <0.1 kU/L
Cottonwood IgE: 0.17 kU/L — AB
D Farinae IgE: 29.7 kU/L — AB
D Pteronyssinus IgE: 21.1 kU/L — AB
Dog Dander IgE: 28.5 kU/L — AB
Elm, American IgE: 0.13 kU/L — AB
Johnson Grass IgE: 0.19 kU/L — AB
Maple/Box Elder IgE: 0.1 kU/L — AB
Mouse Urine IgE: <0.1 kU/L
Oak, White IgE: 0.2 kU/L — AB
Pecan, Hickory IgE: 0.19 kU/L — AB
Penicillium Chrysogen IgE: <0.1 kU/L
Pigweed, Rough IgE: <0.1 kU/L
Ragweed, Short IgE: 2.11 kU/L — AB
Sheep Sorrel IgE Qn: 0.33 kU/L — AB
Timothy Grass IgE: 0.16 kU/L — AB
White Mulberry IgE: <0.1 kU/L

## 2020-10-28 LAB — TRYPTASE: Tryptase: 3.8 ug/L (ref 2.2–13.2)

## 2020-10-28 LAB — ALLERGEN PROFILE, FOOD-FISH
Allergen Mackerel IgE: <0.1 kU/L
Allergen Salmon IgE: 0.83 kU/L — AB
Allergen Trout IgE: 0.43 kU/L — AB
Allergen Walley Pike IgE: 0.7 kU/L — AB
Codfish IgE: 0.72 kU/L — AB
Halibut IgE: 0.78 kU/L — AB
Tuna: 3.13 kU/L — AB

## 2020-10-28 LAB — C1 ESTERASE INHIBITOR, FUNCTIONAL: C1INH Functional/C1INH Total MFr SerPl: >100 %mean normal

## 2020-10-28 LAB — SEDIMENTATION RATE: Sed Rate: 34 mm/hr (ref 0–40)

## 2020-10-28 LAB — CHRONIC URTICARIA: cu index: <2.2 (ref <10)

## 2020-10-28 LAB — ALPHA-GAL PANEL
Allergen Lamb IgE: 1.08 kU/L — AB
Beef IgE: 1.05 kU/L — AB
IgE (Immunoglobulin E), Serum: 2646 IU/mL — ABNORMAL HIGH (ref 6–495)
O215-IgE Alpha-Gal: 0.21 kU/L — AB
Pork IgE: 0.62 kU/L — AB

## 2020-10-28 LAB — ANA W/REFLEX: Anti Nuclear Antibody (ANA): NEGATIVE

## 2020-10-28 LAB — C3 AND C4
Complement C3, Serum: 150 mg/dL (ref 82–167)
Complement C4, Serum: 24 mg/dL (ref 12–38)

## 2020-10-28 LAB — THYROID CASCADE PROFILE: TSH: 2.48 u[IU]/mL (ref 0.450–4.500)

## 2020-10-28 LAB — C1 ESTERASE INHIBITOR: C1INH SerPl-mCnc: 36 mg/dL (ref 21–39)
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
## 2021-01-04 ENCOUNTER — Ambulatory Visit: Payer: 59 | Admitting: Allergy

## 2021-01-04 DIAGNOSIS — J309 Allergic rhinitis, unspecified: Secondary | ICD-10-CM

## 2021-01-04 NOTE — Progress Notes (Deleted)
Follow Up Note  RE: Annette Lindsey MRN: [id number] DOB: [birth year] Date of Office Visit: 01/04/2021  Referring provider: Isaac Bliss, [facility]* Primary care provider: Isaac Bliss, Rayford Halsted, MD  Chief Complaint: No chief complaint on file.  History of Present Illness: I had the pleasure of seeing Annette Lindsey for a follow up visit at the [facility] on 01/04/2021. She is a 60 y.o. female, who is being followed for rash, allergic rhinitis, food allergy, history of bee sting reaction and history of asthma. Her previous allergy office visit was on 10/19/2020 with Dr. Maudie Mercury. Today is a regular follow up visit.  I reviewed the bloodwork. Blood count, kidney function, liver function, electrolytes, thyroid, autoimmune screener, inflammation markers, chronic urticaria index (checks for autoantibodies that trigger mast cells), tryptase (checks for mast cell issues), angioedema panel (checks for swelling issues) were all normal which isgreat.    Alpha gal (checks for red meat allergy) was borderline positive and positive tofinned fish.   Environmental allergy panel was positive to dust mites, cats, dogs, grasspollen, cockroach, tree pollen, ragweed pollen and weed pollen.   Continue to avoid all seafood for now. Also start to avoid all red meat (beef,pork, lamb) and see if it makes the rash better.   See below for environmental control measures. Continue with zyrtec 10mg  dailyand follow up in July as scheduled.  Rash and other nonspecific skin eruption 4 episodes of fluid filled blister on the extremities which then lead to erythema and swelling requiring antibiotics and prednisone. No triggers noted. Denies any insect/bug infestations at home. She has 2 dogs at home  - unsure of tick/flee issues. Based on clinical history not sure what cause these episodes but distribution concerning whether it was triggered by some type of insect/bug bite. Take zyrtec 10mg  daily  to help the itching. See below for proper skin care. Take pictures. Check your dogs to make sure they don't have any fleas/ticks. Check your home as well. Get bloodwork to rule out other etiologies.   Other allergic rhinitis Worse symptoms as a child and used be on AIT. Grass still causes hives. Today's skin testing showed: Positive to grass, weed pollen, trees, dust mites. Borderline to mold. Negative to fire ant. Start environmental control measures as below. May use over the counter antihistamines such as Zyrtec (cetirizine) 10mg  daily as above.   Adverse reaction to food, subsequent encounter Reaction to shellfish in the past. No prior work up. Avoiding all seafood. Today's skin testing showed: Positive to shellfish. Negative to finned fish. Continue strict avoidance of shellfish and seafood. I have prescribed epinephrine injectable and demonstrated proper use. For mild symptoms you can take over the counter antihistamines such as Benadryl and monitor symptoms closely. If symptoms worsen or if you have severe symptoms including breathing issues, throat closure, significant swelling, whole body hives, severe diarrhea and vomiting, lightheadedness then inject epinephrine and seek immediate medical care afterwards. Food action plan given. Get bloodwork for finned fish - if negative will discuss how to introduce it back into your diet.   History of bee sting allergy As a teenager. No prior work up. Continue avoidance. Get bloodwork in future.   History of asthma As a child but no inhaler use for many years. Denies any symptoms. Monitor. Get spirometry if symptoms return.   Return in about 2 months (around 12/19/2020).  Assessment and Plan: Annette Lindsey is a 60 y.o. female with: No problem-specific Assessment & Plan notes found for this  encounter.  No follow-ups on file.  No orders of the defined types were placed in this encounter.  Lab Orders  No laboratory test(s) ordered today     Diagnostics: Spirometry:  Tracings reviewed. Her effort: {Blank single:19197::"Good reproducible efforts.","It was hard to get consistent efforts and there is a question as to whether this reflects a maximal maneuver.","Poor effort, data can not be interpreted."} FVC: ***L FEV1: ***L, ***% predicted FEV1/FVC ratio: ***% Interpretation: {Blank single:19197::"Spirometry consistent with mild obstructive disease","Spirometry consistent with moderate obstructive disease","Spirometry consistent with severe obstructive disease","Spirometry consistent with possible restrictive disease","Spirometry consistent with mixed obstructive and restrictive disease","Spirometry uninterpretable due to technique","Spirometry consistent with normal pattern","No overt abnormalities noted given today's efforts"}.  Please see scanned spirometry results for details.  Skin Testing: {Blank single:19197::"Select foods","Environmental allergy panel","Environmental allergy panel and select foods","Food allergy panel","None","Deferred due to recent antihistamines use"}. Positive test to: ***. Negative test to: ***.  Results discussed with patient/family.   Medication List:  Current Outpatient Medications  Medication Sig Dispense Refill   amLODipine (NORVASC) 10 MG tablet Take 1 tablet (10 mg total) by mouth daily. 90 tablet 1   cetirizine (ZYRTEC ALLERGY) 10 MG tablet Take 1 tablet (10 mg total) by mouth daily. 30 tablet 5   EPINEPHrine 0.3 mg/0.3 mL IJ SOAJ injection Inject 0.3 mg into the muscle as needed for anaphylaxis. 1 each 2   hydrochlorothiazide (HYDRODIURIL) 25 MG tablet Take 1 tablet (25 mg total) by mouth daily. 90 tablet 1   orphenadrine (NORFLEX) 100 MG tablet Take 1 tablet (100 mg total) by mouth 2 (two) times daily. 12 tablet 0   potassium chloride SA (KLOR-CON) 20 MEQ tablet Take 1 tablet (20 mEq total) by mouth daily. 10 tablet 0   No current facility-administered medications for this visit.    Allergies: Allergies  Allergen Reactions   Other Anaphylaxis    Patient has this reaction to grass. Patient also has asthma reaction to smoke (when in closed spaces - cooking, etc.)   Shellfish Allergy Swelling    Of throat Of throat   Shellfish-Derived Products Swelling    Of throat   Lisinopril Cough   I reviewed her past medical history, social history, family history, and environmental history and no significant changes have been reported from her previous visit.  Review of Systems  Constitutional:  Negative for appetite change, chills, fever and unexpected weight change.  HENT:  Negative for congestion and rhinorrhea.   Eyes:  Negative for itching.  Respiratory:  Negative for cough, chest tightness, shortness of breath and wheezing.   Cardiovascular:  Negative for chest pain.  Gastrointestinal:  Negative for abdominal pain.  Genitourinary:  Negative for difficulty urinating.  Skin:  Positive for rash.  Allergic/Immunologic: Positive for environmental allergies and food allergies.  Neurological:  Negative for headaches.   Objective: There were no vitals taken for this visit. There is no height or weight on file to calculate BMI. Physical Exam Vitals and nursing note reviewed.  Constitutional:      Appearance: Normal appearance. She is well-developed.  HENT:     Head: Normocephalic and atraumatic.     Right Ear: Tympanic membrane and external ear normal.     Left Ear: Tympanic membrane and external ear normal.     Nose: Nose normal.     Mouth/Throat:     Mouth: Mucous membranes are moist.     Pharynx: Oropharynx is clear.  Eyes:     Conjunctiva/sclera: Conjunctivae normal.  Cardiovascular:  Rate and Rhythm: Normal rate and regular rhythm.     Heart sounds: Normal heart sounds. No murmur heard.   No friction rub. No gallop.  Pulmonary:     Effort: Pulmonary effort is normal.     Breath sounds: Normal breath sounds. No wheezing, rhonchi or rales.   Musculoskeletal:     Cervical back: Neck supple.  Skin:    General: Skin is warm and dry.     Comments: Dry skin. Hyperpigmented mark where blisters were on the extremities. A few papular erythematous rash on the left wrist area.  Neurological:     Mental Status: She is alert and oriented to person, place, and time.  Psychiatric:        Behavior: Behavior normal.   Previous notes and tests were reviewed. The plan was reviewed with the patient/family, and all questions/concerned were addressed.  It was my pleasure to see Annette Lindsey today and participate in her care. Please feel free to contact me with any questions or concerns.  Sincerely,  Rexene Alberts, DO Allergy & Immunology  [facility] office: [place] office: [phone]

## 2021-03-15 ENCOUNTER — Telehealth: Payer: Self-pay

## 2021-03-15 DIAGNOSIS — I10 Essential (primary) hypertension: Secondary | ICD-10-CM

## 2021-03-15 MED ORDER — AMLODIPINE BESYLATE 10 MG PO TABS: 10.0000 mg | ORAL_TABLET | Freq: Every day | ORAL | 0 refills | Status: DC

## 2021-03-15 NOTE — Telephone Encounter (Signed)
Refill sent.

## 2021-03-15 NOTE — Telephone Encounter (Signed)
Patient called requesting Rx refill on amLODipine (NORVASC) 10 MG tablet

## 2021-03-15 NOTE — Addendum Note (Signed)
Addended by: Westley Hummer B on: 03/15/2021 04:38 PM   Modules accepted: Orders

## 2021-08-22 ENCOUNTER — Telehealth: Payer: Self-pay | Admitting: Internal Medicine

## 2021-08-22 DIAGNOSIS — I10 Essential (primary) hypertension: Secondary | ICD-10-CM

## 2021-08-22 MED ORDER — AMLODIPINE BESYLATE 10 MG PO TABS: 10.0000 mg | ORAL_TABLET | Freq: Every day | ORAL | 0 refills | Status: DC

## 2021-08-22 NOTE — Telephone Encounter (Signed)
Rx done. 

## 2021-08-22 NOTE — Telephone Encounter (Signed)
Pt has cpe sch for 09-20-2021 and would like enough refills on amLODipine (NORVASC) 10 MG tablet  [place] ([place] [place] [facility] [place] [facility] [place] Phone:  [phone]  Fax:  [phone]

## 2021-09-20 ENCOUNTER — Ambulatory Visit (INDEPENDENT_AMBULATORY_CARE_PROVIDER_SITE_OTHER): Payer: No Typology Code available for payment source | Admitting: Internal Medicine

## 2021-09-20 ENCOUNTER — Encounter: Payer: Self-pay | Admitting: Internal Medicine

## 2021-09-20 VITALS — BP 110/88 | HR 77 | Temp 98.6°F | Ht 66.0 in | Wt 286.9 lb

## 2021-09-20 DIAGNOSIS — Z1239 Encounter for other screening for malignant neoplasm of breast: Secondary | ICD-10-CM

## 2021-09-20 DIAGNOSIS — Z23 Encounter for immunization: Secondary | ICD-10-CM

## 2021-09-20 DIAGNOSIS — E1169 Type 2 diabetes mellitus with other specified complication: Secondary | ICD-10-CM | POA: Diagnosis not present

## 2021-09-20 DIAGNOSIS — I10 Essential (primary) hypertension: Secondary | ICD-10-CM

## 2021-09-20 DIAGNOSIS — Z0000 Encounter for general adult medical examination without abnormal findings: Secondary | ICD-10-CM | POA: Diagnosis not present

## 2021-09-20 DIAGNOSIS — Z1211 Encounter for screening for malignant neoplasm of colon: Secondary | ICD-10-CM

## 2021-09-20 DIAGNOSIS — Z124 Encounter for screening for malignant neoplasm of cervix: Secondary | ICD-10-CM

## 2021-09-20 DIAGNOSIS — Z8709 Personal history of other diseases of the respiratory system: Secondary | ICD-10-CM

## 2021-09-20 DIAGNOSIS — E785 Hyperlipidemia, unspecified: Secondary | ICD-10-CM

## 2021-09-20 LAB — CBC WITH DIFFERENTIAL/PLATELET
Basophils Absolute: 0 10*3/uL (ref 0.0–0.1)
Basophils Relative: 0.4 % (ref 0.0–3.0)
Eosinophils Absolute: 0.1 10*3/uL (ref 0.0–0.7)
Eosinophils Relative: 2.1 % (ref 0.0–5.0)
HCT: 39.3 % (ref 36.0–46.0)
Hemoglobin: 13 g/dL (ref 12.0–15.0)
Lymphocytes Relative: 41.1 % (ref 12.0–46.0)
Lymphs Abs: 2.2 10*3/uL (ref 0.7–4.0)
MCHC: 33.1 g/dL (ref 30.0–36.0)
MCV: 83.1 fl (ref 78.0–100.0)
Monocytes Absolute: 0.3 10*3/uL (ref 0.1–1.0)
Monocytes Relative: 6.2 % (ref 3.0–12.0)
Neutro Abs: 2.6 10*3/uL (ref 1.4–7.7)
Neutrophils Relative %: 50.2 % (ref 43.0–77.0)
Platelets: 230 10*3/uL (ref 150.0–400.0)
RBC: 4.73 Mil/uL (ref 3.87–5.11)
RDW: 14.2 % (ref 11.5–15.5)
WBC: 5.2 10*3/uL (ref 4.0–10.5)

## 2021-09-20 LAB — MICROALBUMIN / CREATININE URINE RATIO
Creatinine,U: 175.1 mg/dL
Microalb Creat Ratio: 1.6 mg/g (ref 0.0–30.0)
Microalb, Ur: 2.8 mg/dL — ABNORMAL HIGH (ref 0.0–1.9)

## 2021-09-20 LAB — COMPREHENSIVE METABOLIC PANEL
ALT: 16 U/L (ref 0–35)
AST: 21 U/L (ref 0–37)
Albumin: 4.1 g/dL (ref 3.5–5.2)
Alkaline Phosphatase: 91 U/L (ref 39–117)
BUN: 12 mg/dL (ref 6–23)
CO2: 28 mEq/L (ref 19–32)
Calcium: 9.5 mg/dL (ref 8.4–10.5)
Chloride: 102 mEq/L (ref 96–112)
Creatinine, Ser: 0.69 mg/dL (ref 0.40–1.20)
GFR: 95.37 mL/min (ref >60.00)
Glucose, Bld: 94 mg/dL (ref 70–99)
Potassium: 3.5 mEq/L (ref 3.5–5.1)
Sodium: 138 mEq/L (ref 135–145)
Total Bilirubin: 0.5 mg/dL (ref 0.2–1.2)
Total Protein: 7.5 g/dL (ref 6.0–8.3)

## 2021-09-20 LAB — LIPID PANEL
Cholesterol: 273 mg/dL — ABNORMAL HIGH (ref 0–200)
HDL: 44.8 mg/dL (ref >39.00)
LDL Cholesterol: 204 mg/dL — ABNORMAL HIGH (ref 0–99)
NonHDL: 227.83
Total CHOL/HDL Ratio: 6
Triglycerides: 121 mg/dL (ref 0.0–149.0)
VLDL: 24.2 mg/dL (ref 0.0–40.0)

## 2021-09-20 LAB — VITAMIN D 25 HYDROXY (VIT D DEFICIENCY, FRACTURES): VITD: <7 ng/mL — ABNORMAL LOW (ref 30.00–100.00)

## 2021-09-20 LAB — TSH: TSH: 2.22 u[IU]/mL (ref 0.35–5.50)

## 2021-09-20 LAB — VITAMIN B12: Vitamin B-12: 123 pg/mL — ABNORMAL LOW (ref 211–911)

## 2021-09-20 LAB — HEMOGLOBIN A1C: Hgb A1c MFr Bld: 6.6 % — ABNORMAL HIGH (ref 4.6–6.5)

## 2021-09-20 MED ORDER — CETIRIZINE HCL 10 MG PO TABS: 10.0000 mg | ORAL_TABLET | Freq: Every day | ORAL | 1 refills | Status: DC

## 2021-09-20 MED ORDER — AMLODIPINE BESYLATE 10 MG PO TABS: 10.0000 mg | ORAL_TABLET | Freq: Every day | ORAL | 1 refills | Status: DC

## 2021-09-20 NOTE — Addendum Note (Signed)
Addended by: Westley Hummer B on: 09/20/2021 07:53 AM ? ? Modules accepted: Orders ? ?

## 2021-09-20 NOTE — Progress Notes (Signed)
? ? ? ?Established Patient Office Visit ? ? ? ? ?This visit occurred during the SARS-CoV-2 public health emergency.  Safety protocols were in place, including screening questions prior to the visit, additional usage of staff PPE, and extensive cleaning of exam room while observing appropriate contact time as indicated for disinfecting solutions.  ? ? ?CC/Reason for Visit: Annual preventive exam ? ?HPI: Catha Ontko is a 61 y.o. female who is coming in today for the above mentioned reasons. Past Medical History is significant for: Hypertension, type 2 diabetes, morbid obesity.  She is feeling well and has no acute concerns today.  She has routine dental care but is overdue for an eye exam.  She is due for COVID booster and her shingles vaccine.  She is overdue for all age-appropriate cancer screening including mammogram, colonoscopy, Pap smear. ? ? ?Past Medical/Surgical History: ?Past Medical History:  ?Diagnosis Date  ? Asthma   ? 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 Bruises easily   ? Chest pain   ? Constipation   ? Diabetes mellitus without complication ([place])   ? Generalized headaches   ? Hypercholesteremia   ? Hyperlipidemia   ? Hypertension   ? Metabolic syndrome X   ? Nasal congestion   ? Sore throat   ? Visual disturbance   ? ? ?Past Surgical History:  ?Procedure Laterality Date  ? ABDOMINAL HYSTERECTOMY  1988  ? MASS EXCISION  08/18/11  ? right buttock  ? ? ?Social History: ? reports that she has never smoked. She has never used smokeless tobacco. She reports that she does not drink alcohol and does not use drugs. ? ?Allergies: ?Allergies  ?Allergen Reactions  ? Other Anaphylaxis  ?  Patient has this reaction to grass. Patient also has asthma reaction to smoke (when in closed spaces - cooking, etc.)  ? Shellfish Allergy Swelling  ?  Of throat ?Of throat  ? Shellfish-Derived Products Swelling  ?  Of throat  ? Lisinopril Cough  ? ? ?Family History:  ?Family History  ?Problem Relation  Age of Onset  ? Cancer Father   ?     lung  ? Heart disease Mother   ? ? ? ?Current Outpatient Medications:  ?  EPINEPHrine 0.3 mg/0.3 mL IJ SOAJ injection, Inject 0.3 mg into the muscle as needed for anaphylaxis., Disp: 1 each, Rfl: 2 ?  amLODipine (NORVASC) 10 MG tablet, Take 1 tablet (10 mg total) by mouth daily., Disp: 90 tablet, Rfl: 1 ?  cetirizine (ZYRTEC ALLERGY) 10 MG tablet, Take 1 tablet (10 mg total) by mouth daily., Disp: 90 tablet, Rfl: 1 ?  hydrochlorothiazide (HYDRODIURIL) 25 MG tablet, Take 1 tablet (25 mg total) by mouth daily. (Patient not taking: Reported on 09/20/2021), Disp: 90 tablet, Rfl: 1 ? ?Review of Systems:  ?Constitutional: Denies fever, chills, diaphoresis, appetite change and fatigue.  ?HEENT: Denies photophobia, eye pain, redness, hearing loss, ear pain, congestion, sore throat, rhinorrhea, sneezing, mouth sores, trouble swallowing, neck pain, neck stiffness and tinnitus.   ?Respiratory: Denies SOB, DOE, cough, chest tightness,  and wheezing.   ?Cardiovascular: Denies chest pain, palpitations and leg swelling.  ?Gastrointestinal: Denies nausea, vomiting, abdominal pain, diarrhea, constipation, blood in stool and abdominal distention.  ?Genitourinary: Denies dysuria, urgency, frequency, hematuria, flank pain and difficulty urinating.  ?Endocrine: Denies: hot or cold intolerance, sweats, changes in hair or nails, polyuria, polydipsia. ?Musculoskeletal: Denies myalgias, back pain, joint swelling, arthralgias and gait problem.  ?Skin: Denies pallor, rash and wound.  ?  Neurological: Denies dizziness, seizures, syncope, weakness, light-headedness, numbness and headaches.  ?Hematological: Denies adenopathy. Easy bruising, personal or family bleeding history  ?Psychiatric/Behavioral: Denies suicidal ideation, mood changes, confusion, nervousness, sleep disturbance and agitation ? ? ? ?Physical Exam: ?Vitals:  ? 09/20/21 0714  ?BP: 110/88  ?Pulse: 77  ?Temp: 98.6 ?F (37 ?C)  ?TempSrc: Oral   ?SpO2: 98%  ?Weight: 286 lb 14.4 oz (130.1 kg)  ?Height: '5\' 6"'$  (1.676 m)  ? ? ?Body mass index is 46.31 kg/m?. ? ? ?Constitutional: NAD, calm, comfortable, obese ?Eyes: PERRL, lids and conjunctivae normal ?ENMT: Mucous membranes are moist. Posterior pharynx clear of any exudate or lesions. Normal dentition. Tympanic membrane is pearly white, no erythema or bulging. ?Neck: normal, supple, no masses, no thyromegaly ?Respiratory: clear to auscultation bilaterally, no wheezing, no crackles. Normal respiratory effort. No accessory muscle use.  ?Cardiovascular: Regular rate and rhythm, no murmurs / rubs / gallops. No extremity edema. 2+ pedal pulses. No carotid bruits.  ?Abdomen: no tenderness, no masses palpated. No hepatosplenomegaly. Bowel sounds positive.  ?Musculoskeletal: no clubbing / cyanosis. No joint deformity upper and lower extremities. Good ROM, no contractures. Normal muscle tone.  ?Skin: no rashes, lesions, ulcers. No induration ?Neurologic: CN 2-12 grossly intact. Sensation intact, DTR normal. Strength 5/5 in all 4.  ?Psychiatric: Normal judgment and insight. Alert and oriented x 3. Normal mood.  ? ?Diabetic Foot Exam - Simple   ?Simple Foot Form ?Diabetic Foot exam was performed with the following findings: Yes 09/20/2021  7:32 AM  ?Visual Inspection ?No deformities, no ulcerations, no other skin breakdown bilaterally: Yes ?Sensation Testing ?Intact to touch and monofilament testing bilaterally: Yes ?Pulse Check ?Posterior Tibialis and Dorsalis pulse intact bilaterally: Yes ?Comments ?  ?  ? ? ?Impression and Plan: ? ?Encounter for preventive health examination ?-Recommend routine eye and dental care. ?-Immunizations: First shingles vaccine in office today, advised to get COVID booster at pharmacy, other immunizations are up-to-date ?-Healthy lifestyle discussed in detail. ?-Labs to be updated today. ?-Colon cancer screening: Referred to GI, overdue ?-Breast cancer screening: Overdue, mammogram  requested ?-Cervical cancer screening: Overdue, referral to GYN sent ?-Lung cancer screening: Not applicable ?-Prostate cancer screening: Not applicable ?-DEXA: Not applicable ? ?Screening breast examination ? - Plan: MM Digital Screening ? ?Hyperlipidemia, unspecified hyperlipidemia type ?-Check lipid panel today. ? ?Primary hypertension ?-Well-controlled on amlodipine 10 mg. ? ?Type 2 diabetes mellitus with other specified complication, without long-term current use of insulin ([place]) ? - Plan: CBC with Differential/Platelet, Comprehensive metabolic panel, Hemoglobin A1c, Lipid panel, Microalbumin / creatinine urine ratio ?-Check A1c, microalbumin and refer to ophthalmology today. ? ?Morbid obesity ([place])  ?- Plan: TSH, Vitamin B12, VITAMIN D 25 Hydroxy (Vit-D Deficiency, Fractures) ?-Discussed healthy lifestyle, including increased physical activity and better food choices to promote weight loss. ? ?Need for shingles vaccine ?-First shingles vaccine administered today. ? ?History of asthma  ?- Plan: cetirizine (ZYRTEC ALLERGY) 10 MG tablet ? ? ? ?Patient Instructions  ?-Nice seeing you today!! ? ?-Lab work today; will notify you once results are available. ? ?-First shingles vaccine today. ? ?-Schedule follow up in 3 months. ? ? ? ? ? ?Lelon Frohlich, MD ?[facility] [place] ? ? ?

## 2021-09-20 NOTE — Patient Instructions (Signed)
-  Nice seeing you today!! ? ?-Lab work today; will notify you once results are available. ? ?-First shingles vaccine today. ? ?-Schedule follow up in 3 months. ? ? ?

## 2021-09-22 ENCOUNTER — Encounter: Payer: Self-pay | Admitting: Internal Medicine

## 2021-09-22 ENCOUNTER — Other Ambulatory Visit: Payer: Self-pay | Admitting: Internal Medicine

## 2021-09-22 DIAGNOSIS — E559 Vitamin D deficiency, unspecified: Secondary | ICD-10-CM | POA: Insufficient documentation

## 2021-09-22 DIAGNOSIS — E1129 Type 2 diabetes mellitus with other diabetic kidney complication: Secondary | ICD-10-CM

## 2021-09-22 DIAGNOSIS — E1169 Type 2 diabetes mellitus with other specified complication: Secondary | ICD-10-CM

## 2021-09-22 DIAGNOSIS — E538 Deficiency of other specified B group vitamins: Secondary | ICD-10-CM | POA: Insufficient documentation

## 2021-09-22 MED ORDER — ATORVASTATIN CALCIUM 80 MG PO TABS: 80.0000 mg | ORAL_TABLET | Freq: Every day | ORAL | 1 refills | Status: DC

## 2021-09-22 MED ORDER — VALSARTAN 160 MG PO TABS
160.0000 mg | ORAL_TABLET | Freq: Every day | ORAL | 1 refills | Status: DC
Start: 2021-09-22 — End: 2022-01-17

## 2021-09-22 MED ORDER — VITAMIN D (ERGOCALCIFEROL) 1.25 MG (50000 UNIT) PO CAPS: 50000.0000 [IU] | ORAL_CAPSULE | ORAL | 0 refills | Status: AC

## 2021-09-22 NOTE — Progress Notes (Signed)
A few issues here: ? ?1. Vit D def: 50000 IU weekly x 12 weeks with follow up levels then. Will send Rx. ? ?2. Vit B12 deficiency: IM weekly x 4 then monthly. In office or send supplies? ? ?3. A1c increased to 6.6 but still at goal. Continue to focus on lifestyle changes. ? ?4. 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 Start valsartan 160 mg daily. ? ?5. Cholesterol is EXTREMELY elevated. Start lipitor 80 mg at bedtime and recheck lipids in 3 months. ? ?

## 2021-09-26 ENCOUNTER — Other Ambulatory Visit: Payer: Self-pay | Admitting: Internal Medicine

## 2021-09-26 ENCOUNTER — Telehealth: Payer: Self-pay | Admitting: Internal Medicine

## 2021-09-26 DIAGNOSIS — E559 Vitamin D deficiency, unspecified: Secondary | ICD-10-CM

## 2021-09-26 DIAGNOSIS — E1169 Type 2 diabetes mellitus with other specified complication: Secondary | ICD-10-CM

## 2021-09-26 NOTE — Telephone Encounter (Signed)
Pt return your call and stated you can call her back and if she don't answer you can leave her a message. ?

## 2021-09-26 NOTE — Telephone Encounter (Signed)
Pt call and stated she is returning your call and want a call back. 

## 2021-09-26 NOTE — Telephone Encounter (Signed)
Left detailed message on machine  ?Released to mychart with a result note ? ?

## 2021-09-27 ENCOUNTER — Encounter: Payer: Self-pay | Admitting: Internal Medicine

## 2021-09-27 MED ORDER — CYANOCOBALAMIN 1000 MCG/ML IJ SOLN: INTRAMUSCULAR | 1 refills | Status: DC

## 2021-09-27 MED ORDER — BD SAFETYGLIDE SYRINGE/NEEDLE 25G X 1" 3 ML MISC
11 refills | Status: DC
Start: 2021-09-27 — End: 2022-06-22

## 2021-09-27 NOTE — Telephone Encounter (Signed)
Responded to Mychart message.

## 2021-11-10 ENCOUNTER — Encounter: Payer: Self-pay | Admitting: Gastroenterology

## 2021-11-23 ENCOUNTER — Ambulatory Visit: Payer: No Typology Code available for payment source | Admitting: Student

## 2022-01-13 ENCOUNTER — Encounter: Payer: No Typology Code available for payment source | Admitting: Gastroenterology

## 2022-01-17 ENCOUNTER — Other Ambulatory Visit: Payer: Self-pay

## 2022-01-17 ENCOUNTER — Ambulatory Visit (AMBULATORY_SURGERY_CENTER): Payer: Self-pay

## 2022-01-17 VITALS — Ht 66.0 in | Wt 292.0 lb

## 2022-01-17 DIAGNOSIS — Z1211 Encounter for screening for malignant neoplasm of colon: Secondary | ICD-10-CM

## 2022-01-17 MED ORDER — NA SULFATE-K SULFATE-MG SULF 17.5-3.13-1.6 GM/177ML PO SOLN: 1.0000 | Freq: Once | ORAL | 0 refills | Status: AC

## 2022-01-17 NOTE — Progress Notes (Signed)
Denies allergies to eggs or soy products. Denies complication of anesthesia or sedation. Denies use of weight loss medication. Denies use of O2.   Emmi instructions given for colonoscopy.  

## 2022-02-10 ENCOUNTER — Encounter: Payer: No Typology Code available for payment source | Admitting: Gastroenterology

## 2022-04-14 ENCOUNTER — Ambulatory Visit (INDEPENDENT_AMBULATORY_CARE_PROVIDER_SITE_OTHER): Payer: No Typology Code available for payment source

## 2022-04-14 DIAGNOSIS — Z23 Encounter for immunization: Secondary | ICD-10-CM

## 2022-06-22 ENCOUNTER — Encounter: Payer: Self-pay | Admitting: Internal Medicine

## 2022-06-22 ENCOUNTER — Ambulatory Visit (INDEPENDENT_AMBULATORY_CARE_PROVIDER_SITE_OTHER): Payer: No Typology Code available for payment source | Admitting: Internal Medicine

## 2022-06-22 VITALS — BP 167/96 | HR 94 | Temp 97.9°F | Wt 293.7 lb

## 2022-06-22 DIAGNOSIS — I10 Essential (primary) hypertension: Secondary | ICD-10-CM | POA: Diagnosis not present

## 2022-06-22 DIAGNOSIS — E1169 Type 2 diabetes mellitus with other specified complication: Secondary | ICD-10-CM | POA: Diagnosis not present

## 2022-06-22 LAB — POCT GLYCOSYLATED HEMOGLOBIN (HGB A1C): Hemoglobin A1C: 6.4 % — AB (ref 4.0–5.6)

## 2022-06-22 MED ORDER — AMLODIPINE BESYLATE 10 MG PO TABS: 10.0000 mg | ORAL_TABLET | Freq: Every day | ORAL | 1 refills | Status: DC

## 2022-06-22 MED ORDER — HYDROCHLOROTHIAZIDE 25 MG PO TABS: 25.0000 mg | ORAL_TABLET | Freq: Every day | ORAL | 1 refills | Status: DC

## 2022-06-22 MED ORDER — TIRZEPATIDE 2.5 MG/0.5ML ~~LOC~~ SOAJ: 2.5000 mg | SUBCUTANEOUS | 0 refills | Status: DC

## 2022-06-22 NOTE — Progress Notes (Signed)
Established Patient Office Visit     CC/Reason for Visit: Follow-up chronic conditions  HPI: Annette Lindsey is a 61 y.o. female who is coming in today for the above mentioned reasons. Past Medical History is significant for: Hypertension, hyperlipidemia, type 2 diabetes, morbid obesity.  She would like to discuss treatment for her diabetes and obesity.  She has ran out of her blood pressure medication.  She is otherwise feeling well.   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 She has never used smokeless tobacco. She reports that she does not drink alcohol and does not use drugs.  Allergies: Allergies  Allergen Reactions   Other Anaphylaxis    Patient has this reaction to grass. Patient also has asthma reaction to smoke (when in closed spaces - cooking, etc.)   Shellfish Allergy Swelling    Of throat Of throat   Shellfish-Derived Products Swelling    Of throat   Lisinopril Cough    Family History:  Family History  Problem Relation Age of Onset   Heart disease Mother    Cancer Father        lung   Colon cancer Neg Hx    Esophageal cancer Neg Hx    Rectal cancer Neg Hx    Stomach cancer Neg Hx      Current Outpatient Medications:    atorvastatin (LIPITOR) 80 MG tablet, Take 1 tablet (80 mg total) by mouth daily., Disp:  90 tablet, Rfl: 1   cetirizine (ZYRTEC ALLERGY) 10 MG tablet, Take 1 tablet (10 mg total) by mouth daily., Disp: 90 tablet, Rfl: 1   cyanocobalamin (,VITAMIN B-12,) 1000 MCG/ML injection, Inject 43m in deltoid once weekly for 4 weeks, then inject 1 ml once a month thereafter, Disp: 6 mL, Rfl: 1   EPINEPHrine 0.3 mg/0.3 mL IJ SOAJ injection, Inject 0.3 mg into the muscle as needed for anaphylaxis., Disp: 1 each, Rfl: 2   tirzepatide (MOUNJARO) 2.5 MG/0.5ML Pen, Inject 2.5 mg into the skin once a week., Disp: 2 mL, Rfl: 0   amLODipine (NORVASC) 10 MG tablet, Take 1 tablet (10 mg total) by mouth daily., Disp: 90 tablet, Rfl: 1   hydrochlorothiazide (HYDRODIURIL) 25 MG tablet, Take 1 tablet (25 mg total) by mouth daily., Disp: 90 tablet, Rfl: 1  Review of Systems:  Constitutional: Denies fever, chills, diaphoresis, appetite change and fatigue.  HEENT: Denies photophobia, eye pain, redness, hearing loss, ear pain, congestion, sore throat, rhinorrhea, sneezing, mouth sores, trouble swallowing, neck pain, neck stiffness and tinnitus.   Respiratory: Denies SOB, DOE, cough, chest tightness,  and wheezing.   Cardiovascular: Denies chest pain, palpitations and leg swelling.  Gastrointestinal: Denies nausea, vomiting, abdominal pain, diarrhea, constipation, blood in stool and abdominal  distention.  Genitourinary: Denies dysuria, urgency, frequency, hematuria, flank pain and difficulty urinating.  Endocrine: Denies: hot or cold intolerance, sweats, changes in hair or nails, polyuria, polydipsia. Musculoskeletal: Denies myalgias, back pain, joint swelling, arthralgias and gait problem.  Skin: Denies pallor, rash and wound.  Neurological: Denies dizziness, seizures, syncope, weakness, light-headedness, numbness and headaches.  Hematological: Denies adenopathy. Easy bruising, personal or family bleeding history  Psychiatric/Behavioral: Denies suicidal ideation, mood changes, confusion, nervousness, sleep  disturbance and agitation    Physical Exam: Vitals:   06/22/22 1446 06/22/22 1447  BP: (!) 174/97 (!) 167/96  Pulse: 94   Temp: 97.9 F (36.6 C)   TempSrc: Oral   SpO2: 98%   Weight: 293 lb 11.2 oz (133.2 kg)     Body mass index is 47.4 kg/m.   Constitutional: NAD, calm, comfortable, obese Eyes: PERRL, lids and conjunctivae normal ENMT: Mucous membranes are moist.  Respiratory: clear to auscultation bilaterally, no wheezing, no crackles. Normal respiratory effort. No accessory muscle use.  Cardiovascular: Regular rate and rhythm, no murmurs / rubs / gallops. No extremity edema.  Psychiatric: Normal judgment and insight. Alert and oriented x 3. Normal mood.    Impression and Plan:  Type 2 diabetes mellitus with other specified complication, without long-term current use of insulin ([place]) - Plan: POCT glycosylated hemoglobin (Hb A1C), tirzepatide (MOUNJARO) 2.5 MG/0.5ML Pen  Essential hypertension - Plan: amLODipine (NORVASC) 10 MG tablet, hydrochlorothiazide (HYDRODIURIL) 25 MG tablet  Morbid obesity (HCC)  -Pressure is uncontrolled, she has run out of both amlodipine and hydrochlorothiazide.  I will refill today, have advised that she do ambulatory blood pressure measurements and return in 3 months for follow-up. -After discussion, we have decided to start [facility] to treat her diabetes.  I would expect her to have some weight loss as well while on this medication.  We have discussed potential side effects.  She will follow-up in 3 months.  Time spent:32 minutes reviewing chart, interviewing and examining patient and formulating plan of care.       Lelon Frohlich, MD [facility] at [facility]

## 2022-06-23 ENCOUNTER — Telehealth: Payer: Self-pay

## 2022-06-23 ENCOUNTER — Other Ambulatory Visit (HOSPITAL_COMMUNITY): Payer: Self-pay

## 2022-06-23 NOTE — Telephone Encounter (Signed)
Pharmacy Patient Advocate Encounter   Received notification that prior authorization for Mounjaro 2.'5MG'$ /0.5ML pen-injectors is required/requested.   PA submitted on 06/23/22 to (ins) [employer] via CoverMyMeds Key [id number]  Status is pending

## 2022-06-27 ENCOUNTER — Telehealth: Payer: Self-pay | Admitting: Internal Medicine

## 2022-06-27 ENCOUNTER — Other Ambulatory Visit (HOSPITAL_COMMUNITY): Payer: Self-pay

## 2022-06-27 NOTE — Telephone Encounter (Signed)
Patient Advocate Encounter  Prior Authorization for [employer] 2.'5MG'$ /0.5ML pen-injectors has been approved.    PA# [id number] Effective dates: 06/23/2022 through 06/24/2023  [facility] pharmacy, received paid claim

## 2022-06-27 NOTE — Telephone Encounter (Signed)
Pt called to check on the status of her PA for [facility]. Pt stated pharm sent it on Thurs. 06/22/22 and currently she can't get any meds without it. Pt would like a call back once sent back to the pharm. Pt is aware Dr is out of the office.   Call [phone]  Please advise.

## 2022-07-13 ENCOUNTER — Telehealth: Payer: Self-pay | Admitting: Internal Medicine

## 2022-07-13 ENCOUNTER — Other Ambulatory Visit: Payer: Self-pay | Admitting: Internal Medicine

## 2022-07-13 MED ORDER — TIRZEPATIDE 5 MG/0.5ML ~~LOC~~ SOAJ: 5.0000 mg | SUBCUTANEOUS | 0 refills | Status: DC

## 2022-07-13 NOTE — Telephone Encounter (Signed)
Pt is calling and she is ready to go to next mg. Pt is not having any problems tirzepatide [facility])  [place] [address], [place] - [address] Phone: [phone]  Fax: [phone]

## 2022-07-17 ENCOUNTER — Ambulatory Visit: Payer: No Typology Code available for payment source | Admitting: Internal Medicine

## 2022-09-13 ENCOUNTER — Telehealth: Payer: Self-pay | Admitting: Internal Medicine

## 2022-09-13 NOTE — Telephone Encounter (Signed)
Pt is calling and would like increase mounjaro to next mg   [place], [place] [address] Phone: [phone]  Fax: [phone]

## 2022-09-18 ENCOUNTER — Other Ambulatory Visit: Payer: Self-pay | Admitting: Internal Medicine

## 2022-09-18 DIAGNOSIS — E1169 Type 2 diabetes mellitus with other specified complication: Secondary | ICD-10-CM

## 2022-09-18 MED ORDER — TIRZEPATIDE 7.5 MG/0.5ML ~~LOC~~ SOAJ: 7.5000 mg | SUBCUTANEOUS | 0 refills | Status: DC

## 2022-09-25 ENCOUNTER — Other Ambulatory Visit: Payer: Self-pay | Admitting: Internal Medicine

## 2022-10-04 ENCOUNTER — Other Ambulatory Visit (HOSPITAL_COMMUNITY): Payer: Self-pay

## 2022-10-04 ENCOUNTER — Encounter: Payer: Self-pay | Admitting: Internal Medicine

## 2022-10-04 ENCOUNTER — Ambulatory Visit (INDEPENDENT_AMBULATORY_CARE_PROVIDER_SITE_OTHER): Payer: No Typology Code available for payment source | Admitting: Internal Medicine

## 2022-10-04 VITALS — BP 136/83 | HR 12 | Temp 98.5°F | Wt 246.4 lb

## 2022-10-04 DIAGNOSIS — Z8709 Personal history of other diseases of the respiratory system: Secondary | ICD-10-CM | POA: Diagnosis not present

## 2022-10-04 DIAGNOSIS — I10 Essential (primary) hypertension: Secondary | ICD-10-CM | POA: Diagnosis not present

## 2022-10-04 DIAGNOSIS — E1169 Type 2 diabetes mellitus with other specified complication: Secondary | ICD-10-CM

## 2022-10-04 DIAGNOSIS — E785 Hyperlipidemia, unspecified: Secondary | ICD-10-CM

## 2022-10-04 LAB — POCT GLYCOSYLATED HEMOGLOBIN (HGB A1C): Hemoglobin A1C: 6 % — AB (ref 4.0–5.6)

## 2022-10-04 MED ORDER — CETIRIZINE HCL 10 MG PO TABS: 10.0000 mg | ORAL_TABLET | Freq: Every day | ORAL | 1 refills | Status: AC

## 2022-10-04 MED ORDER — TIRZEPATIDE 7.5 MG/0.5ML ~~LOC~~ SOAJ
7.5000 mg | SUBCUTANEOUS | 0 refills | Status: DC
  Filled 2022-10-04 – 2022-10-05 (×4): qty 2, 28d supply, fill #0
  Filled 2022-11-01: qty 2, 28d supply, fill #1

## 2022-10-04 NOTE — Progress Notes (Signed)
Established Patient Office Visit     CC/Reason for Visit: Follow-up chronic conditions  HPI: Annette Lindsey is a 62 y.o. female who is coming in today for the above mentioned reasons. Past Medical History is significant for: Hypertension, hyperlipidemia, type 2 diabetes, morbid obesity.  Since I last saw her she has lost 50 pounds.  She is on Mounjaro 5 mg weekly and tolerating well.   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 She has never used smokeless tobacco. She reports that she does not drink alcohol and does not use drugs.  Allergies: Allergies  Allergen Reactions   Other Anaphylaxis    Patient has this reaction to grass. Patient also has asthma reaction to smoke (when in closed spaces - cooking, etc.)   Shellfish Allergy Swelling    Of throat Of throat   Shellfish-Derived Products Swelling    Of throat   Lisinopril Cough    Family History:  Family History  Problem Relation Age of Onset   Heart disease Mother    Cancer Father        lung   Colon cancer Neg Hx    Esophageal cancer Neg Hx    Rectal cancer Neg Hx    Stomach cancer Neg Hx      Current Outpatient Medications:    amLODipine (NORVASC) 10 MG tablet, Take 1 tablet (10 mg total) by mouth daily., Disp: 90 tablet, Rfl: 1   atorvastatin (LIPITOR) 80 MG tablet,  Take 1 tablet (80 mg total) by mouth daily., Disp: 90 tablet, Rfl: 1   cyanocobalamin (,VITAMIN B-12,) 1000 MCG/ML injection, Inject 18ml in deltoid once weekly for 4 weeks, then inject 1 ml once a month thereafter, Disp: 6 mL, Rfl: 1   EPINEPHrine 0.3 mg/0.3 mL IJ SOAJ injection, Inject 0.3 mg into the muscle as needed for anaphylaxis., Disp: 1 each, Rfl: 2   hydrochlorothiazide (HYDRODIURIL) 25 MG tablet, Take 1 tablet (25 mg total) by mouth daily., Disp: 90 tablet, Rfl: 1   MOUNJARO 5 MG/0.5ML Pen, INJECT 5MG  INTO THE SKIN ONCE A WEEK, Disp: 4 mL, Rfl: 0   tirzepatide (MOUNJARO) 7.5 MG/0.5ML Pen, Inject 7.5 mg into the skin once a week., Disp: 6 mL, Rfl: 0   cetirizine (ZYRTEC ALLERGY) 10 MG tablet, Take 1 tablet (10 mg total) by mouth daily., Disp: 90 tablet, Rfl: 1  Review of Systems:  Negative unless indicated in HPI.   Physical Exam: Vitals:   10/04/22 1458  BP: 136/83  Pulse: (!) 12  Temp: 98.5 F (36.9 C)  TempSrc: Oral  SpO2: 94%  Weight: 246 lb 6.4 oz (111.8 kg)    Body mass index is 39.77 kg/m.   Physical Exam Vitals reviewed.  Constitutional:  Appearance: Normal appearance.  HENT:     Head: Normocephalic and atraumatic.  Eyes:     Conjunctiva/sclera: Conjunctivae normal.     Pupils: Pupils are equal, round, and reactive to light.  Cardiovascular:     Rate and Rhythm: Normal rate and regular rhythm.  Pulmonary:     Effort: Pulmonary effort is normal.     Breath sounds: Normal breath sounds.  Skin:    General: Skin is warm and dry.  Neurological:     General: No focal deficit present.     Mental Status: She is alert and oriented to person, place, and time.  Psychiatric:        Mood and Affect: Mood normal.        Behavior: Behavior normal.        Thought Content: Thought content normal.        Judgment: Judgment normal.      Impression and Plan:  Type 2 diabetes mellitus with other specified complication, without long-term current use of insulin  - Plan: POC HgB A1c, Urine microalbumin-creatinine with uACR  History of asthma - Plan: cetirizine (ZYRTEC ALLERGY) 10 MG tablet  Hyperlipidemia associated with type 2 diabetes mellitus  Primary hypertension  Morbid obesity due to excess calories  -Congratulated on weight loss success.  Increase Mounjaro to 7.5 mg weekly. -A1c demonstrates excellent diabetic control at 6.0. -Blood pressure is fairly well-controlled.  Time spent:32 minutes reviewing chart, interviewing and examining patient and formulating plan of care.     Lelon Frohlich, MD [facility] at [facility]

## 2022-10-05 ENCOUNTER — Other Ambulatory Visit (HOSPITAL_COMMUNITY): Payer: Self-pay

## 2022-10-05 ENCOUNTER — Other Ambulatory Visit: Payer: Self-pay

## 2022-10-06 ENCOUNTER — Other Ambulatory Visit (HOSPITAL_COMMUNITY): Payer: Self-pay

## 2022-11-01 ENCOUNTER — Other Ambulatory Visit (HOSPITAL_COMMUNITY): Payer: Self-pay

## 2022-11-01 ENCOUNTER — Encounter (HOSPITAL_COMMUNITY): Payer: Self-pay

## 2022-12-04 ENCOUNTER — Other Ambulatory Visit (HOSPITAL_BASED_OUTPATIENT_CLINIC_OR_DEPARTMENT_OTHER): Payer: Self-pay

## 2022-12-04 ENCOUNTER — Other Ambulatory Visit (HOSPITAL_COMMUNITY): Payer: Self-pay

## 2023-01-01 ENCOUNTER — Telehealth: Payer: Self-pay | Admitting: Internal Medicine

## 2023-01-01 DIAGNOSIS — I10 Essential (primary) hypertension: Secondary | ICD-10-CM

## 2023-01-01 MED ORDER — AMLODIPINE BESYLATE 10 MG PO TABS
10.0000 mg | ORAL_TABLET | Freq: Every day | ORAL | 1 refills | Status: DC
Start: 2023-01-01 — End: 2023-07-09

## 2023-01-01 MED ORDER — HYDROCHLOROTHIAZIDE 25 MG PO TABS
25.0000 mg | ORAL_TABLET | Freq: Every day | ORAL | 1 refills | Status: DC
Start: 2023-01-01 — End: 2023-07-16

## 2023-01-01 NOTE — Telephone Encounter (Signed)
Prescription Request  01/01/2023  LOV: 10/04/2022  What is the name of the medication or equipment?     amLODipine (NORVASC) 10 MG tablet hydrochlorothiazide (HYDRODIURIL) 25 MG tablet   Have you contacted your pharmacy to request a refill? Yes  [facility] [place], [place] - [address] Phone: [phone]  Fax: [phone]     Patient notified that their request is being sent to the clinical staff for review and that they should receive a response within 2 business days.   Please advise at Mobile [phone] (mobile)

## 2023-01-01 NOTE — Telephone Encounter (Signed)
Rx done. 

## 2023-01-03 ENCOUNTER — Ambulatory Visit: Payer: No Typology Code available for payment source | Admitting: Internal Medicine

## 2023-01-15 ENCOUNTER — Encounter: Payer: Self-pay | Admitting: Internal Medicine

## 2023-01-15 ENCOUNTER — Ambulatory Visit (INDEPENDENT_AMBULATORY_CARE_PROVIDER_SITE_OTHER): Payer: No Typology Code available for payment source | Admitting: Internal Medicine

## 2023-01-15 VITALS — BP 136/78 | HR 75 | Temp 98.4°F | Wt 211.0 lb

## 2023-01-15 DIAGNOSIS — I10 Essential (primary) hypertension: Secondary | ICD-10-CM

## 2023-01-15 DIAGNOSIS — K5900 Constipation, unspecified: Secondary | ICD-10-CM | POA: Diagnosis not present

## 2023-01-15 DIAGNOSIS — Z7985 Long-term (current) use of injectable non-insulin antidiabetic drugs: Secondary | ICD-10-CM

## 2023-01-15 DIAGNOSIS — E785 Hyperlipidemia, unspecified: Secondary | ICD-10-CM

## 2023-01-15 DIAGNOSIS — E1169 Type 2 diabetes mellitus with other specified complication: Secondary | ICD-10-CM | POA: Diagnosis not present

## 2023-01-15 LAB — POCT GLYCOSYLATED HEMOGLOBIN (HGB A1C): Hemoglobin A1C: 5.6 % (ref 4.0–5.6)

## 2023-01-15 MED ORDER — TIRZEPATIDE 10 MG/0.5ML ~~LOC~~ SOAJ
10.0000 mg | SUBCUTANEOUS | 0 refills | Status: DC
Start: 2023-01-15 — End: 2023-05-17

## 2023-01-15 NOTE — Assessment & Plan Note (Signed)
Well controlled with an A1c of 5.6. Increase mounjaro to 10 mg.

## 2023-01-15 NOTE — Assessment & Plan Note (Signed)
On atorvastatin. Recheck lipids next visit.

## 2023-01-15 NOTE — Progress Notes (Signed)
Established Patient Office Visit     CC/Reason for Visit: Follow-up chronic conditions  HPI: Annette Lindsey is a 62 y.o. female who is coming in today for the above mentioned reasons. Past Medical History is significant for: Hypertension, hyperlipidemia, type 2 diabetes, obesity.  She is doing well.  She has lost 20 more pounds since her last visit.   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 She has never used smokeless tobacco. She reports that she does not drink alcohol and does not use drugs.  Allergies: Allergies  Allergen Reactions   Other Anaphylaxis    Patient has this reaction to grass. Patient also has asthma reaction to smoke (when in closed spaces - cooking, etc.)   Shellfish Allergy Swelling    Of throat Of throat   Shellfish-Derived Products Swelling    Of throat   Lisinopril Cough    Family History:  Family History  Problem Relation Age of Onset   Heart disease Mother    Cancer Father        lung   Colon cancer Neg Hx    Esophageal cancer Neg Hx    Rectal cancer Neg Hx    Stomach cancer Neg Hx      Current Outpatient Medications:    amLODipine (NORVASC) 10 MG tablet, Take 1 tablet (10 mg total) by mouth daily., Disp: 90 tablet, Rfl: 1   atorvastatin (LIPITOR) 80 MG tablet, Take 1 tablet (80 mg total)  by mouth daily., Disp: 90 tablet, Rfl: 1   cetirizine (ZYRTEC ALLERGY) 10 MG tablet, Take 1 tablet (10 mg total) by mouth daily., Disp: 90 tablet, Rfl: 1   cyanocobalamin (,VITAMIN B-12,) 1000 MCG/ML injection, Inject 1ml in deltoid once weekly for 4 weeks, then inject 1 ml once a month thereafter, Disp: 6 mL, Rfl: 1   EPINEPHrine 0.3 mg/0.3 mL IJ SOAJ injection, Inject 0.3 mg into the muscle as needed for anaphylaxis., Disp: 1 each, Rfl: 2   hydrochlorothiazide (HYDRODIURIL) 25 MG tablet, Take 1 tablet (25 mg total) by mouth daily., Disp: 90 tablet, Rfl: 1   tirzepatide (MOUNJARO) 10 MG/0.5ML Pen, Inject 10 mg into the skin once a week., Disp: 6 mL, Rfl: 0  Review of Systems:  Negative unless indicated in HPI.   Physical Exam: Vitals:   01/15/23 1509 01/15/23 1514  BP: (!) 140/70 136/78  Pulse: 75   Temp: 98.4 F (36.9 C)   TempSrc: Oral   SpO2: 99%   Weight: 211 lb (95.7 kg)     Body mass index is 34.06 kg/m.   Physical Exam Vitals reviewed.  Constitutional:      Appearance: Normal appearance.  HENT:     Head: Normocephalic and atraumatic.  Eyes:  Conjunctiva/sclera: Conjunctivae normal.     Pupils: Pupils are equal, round, and reactive to light.  Cardiovascular:     Rate and Rhythm: Normal rate and regular rhythm.  Pulmonary:     Effort: Pulmonary effort is normal.     Breath sounds: Normal breath sounds.  Skin:    General: Skin is warm and dry.  Neurological:     General: No focal deficit present.     Mental Status: She is alert and oriented to person, place, and time.  Psychiatric:        Mood and Affect: Mood normal.        Behavior: Behavior normal.        Thought Content: Thought content normal.        Judgment: Judgment normal.      Impression and Plan:  Type 2 diabetes mellitus with other specified complication, without long-term current use of insulin (HCC) Assessment & Plan: Well controlled with an A1c of 5.6. Increase mounjaro to 10  mg.  Orders: -     POCT glycosylated hemoglobin (Hb A1C) -     Microalbumin / creatinine urine ratio; Future -     Tirzepatide; Inject 10 mg into the skin once a week.  Dispense: 6 mL; Refill: 0  Hyperlipidemia associated with type 2 diabetes mellitus (HCC) Assessment & Plan: On atorvastatin. Recheck lipids next visit.   Primary hypertension Assessment & Plan: Fair control on current.   Constipation, unspecified constipation type  -Advised she increase water consumption and may use over-the-counter stool softeners as needed.   Time spent:31 minutes reviewing chart, interviewing and examining patient and formulating plan of care.     Chaya Jan, MD [facility] at [facility]

## 2023-01-15 NOTE — Assessment & Plan Note (Signed)
Fair control on current.

## 2023-02-26 ENCOUNTER — Ambulatory Visit: Payer: No Typology Code available for payment source | Admitting: Internal Medicine

## 2023-05-17 ENCOUNTER — Ambulatory Visit (INDEPENDENT_AMBULATORY_CARE_PROVIDER_SITE_OTHER): Payer: PRIVATE HEALTH INSURANCE | Admitting: Internal Medicine

## 2023-05-17 VITALS — BP 155/81 | HR 76 | Temp 98.2°F | Wt 181.2 lb

## 2023-05-17 DIAGNOSIS — Z7985 Long-term (current) use of injectable non-insulin antidiabetic drugs: Secondary | ICD-10-CM

## 2023-05-17 DIAGNOSIS — E785 Hyperlipidemia, unspecified: Secondary | ICD-10-CM

## 2023-05-17 DIAGNOSIS — I10 Essential (primary) hypertension: Secondary | ICD-10-CM | POA: Diagnosis not present

## 2023-05-17 DIAGNOSIS — E1169 Type 2 diabetes mellitus with other specified complication: Secondary | ICD-10-CM

## 2023-05-17 LAB — POCT GLYCOSYLATED HEMOGLOBIN (HGB A1C): Hemoglobin A1C: 5.4 % (ref 4.0–5.6)

## 2023-05-17 MED ORDER — TIRZEPATIDE 10 MG/0.5ML ~~LOC~~ SOAJ
10.0000 mg | SUBCUTANEOUS | 0 refills | Status: DC
Start: 2023-05-17 — End: 2023-09-10

## 2023-05-17 NOTE — Assessment & Plan Note (Signed)
Not well-controlled but at home blood pressures usually in the 130/80 range.  She will do ambulatory blood pressure measurements and we will reassess at next visit.

## 2023-05-17 NOTE — Assessment & Plan Note (Signed)
Well controlled with an A1c of 5.4. continue mounjaro  10 mg.

## 2023-05-17 NOTE — Progress Notes (Signed)
Established Patient Office Visit     CC/Reason for Visit: Follow-up chronic conditions  HPI: Annette Lindsey is a 62 y.o. female who is coming in today for the above mentioned reasons. Past Medical History is significant for: Hypertension, hyperlipidemia, type 2 diabetes, morbid obesity.  She has done well on Mounjaro 10 mg.  She is feeling well without concerns or complaints.  She states her ambulatory blood pressure measurements are usually in the 130 over low 80 range.   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 She has never used smokeless tobacco. She reports that she does not drink alcohol and does not use drugs.  Allergies: Allergies  Allergen Reactions   Other Anaphylaxis    Patient has this reaction to grass. Patient also has asthma reaction to smoke (when in closed spaces - cooking, etc.)   Shellfish Allergy Swelling    Of throat Of throat   Shellfish-Derived Products Swelling    Of throat   Lisinopril Cough    Family History:  Family History  Problem Relation Age of Onset   Heart disease Mother    Cancer Father        lung   Colon cancer Neg Hx    Esophageal cancer Neg Hx    Rectal cancer Neg Hx    Stomach cancer Neg Hx      Current Outpatient Medications:    amLODipine (NORVASC) 10 MG tablet, Take 1 tablet  (10 mg total) by mouth daily., Disp: 90 tablet, Rfl: 1   atorvastatin (LIPITOR) 80 MG tablet, Take 1 tablet (80 mg total) by mouth daily., Disp: 90 tablet, Rfl: 1   cetirizine (ZYRTEC ALLERGY) 10 MG tablet, Take 1 tablet (10 mg total) by mouth daily., Disp: 90 tablet, Rfl: 1   cyanocobalamin (,VITAMIN B-12,) 1000 MCG/ML injection, Inject 1ml in deltoid once weekly for 4 weeks, then inject 1 ml once a month thereafter, Disp: 6 mL, Rfl: 1   EPINEPHrine 0.3 mg/0.3 mL IJ SOAJ injection, Inject 0.3 mg into the muscle as needed for anaphylaxis., Disp: 1 each, Rfl: 2   hydrochlorothiazide (HYDRODIURIL) 25 MG tablet, Take 1 tablet (25 mg total) by mouth daily., Disp: 90 tablet, Rfl: 1   tirzepatide (MOUNJARO) 10 MG/0.5ML Pen, Inject 10 mg into the skin once a week., Disp: 6 mL, Rfl: 0  Review of Systems:  Negative unless indicated in HPI.   Physical Exam: Vitals:   05/17/23 1526 05/17/23 1530  BP: (!) 160/90 (!) 155/81  Pulse: 76   Temp: 98.2 F (36.8 C)   TempSrc: Oral   SpO2: 99%   Weight: 181 lb 3.2 oz (82.2 kg)     Body mass index is 29.25 kg/m.   Physical Exam Vitals  reviewed.  Constitutional:      Appearance: Normal appearance.  HENT:     Head: Normocephalic and atraumatic.  Eyes:     Conjunctiva/sclera: Conjunctivae normal.     Pupils: Pupils are equal, round, and reactive to light.  Cardiovascular:     Rate and Rhythm: Normal rate and regular rhythm.  Pulmonary:     Effort: Pulmonary effort is normal.     Breath sounds: Normal breath sounds.  Skin:    General: Skin is warm and dry.  Neurological:     General: No focal deficit present.     Mental Status: She is alert and oriented to person, place, and time.  Psychiatric:        Mood and Affect: Mood normal.        Behavior: Behavior normal.        Thought Content: Thought content normal.        Judgment: Judgment normal.      Impression and Plan:  Type 2 diabetes mellitus with other specified complication,  without long-term current use of insulin (HCC) Assessment & Plan: Well controlled with an A1c of 5.4. continue mounjaro  10 mg.  Orders: -     POCT glycosylated hemoglobin (Hb A1C) -     Microalbumin / creatinine urine ratio -     Tirzepatide; Inject 10 mg into the skin once a week.  Dispense: 6 mL; Refill: 0  Hyperlipidemia associated with type 2 diabetes mellitus (HCC) Assessment & Plan: On atorvastatin. Recheck lipids next visit.   Primary hypertension Assessment & Plan: Not well-controlled but at home blood pressures usually in the 130/80 range.  She will do ambulatory blood pressure measurements and we will reassess at next visit.      Time spent:31 minutes reviewing chart, interviewing and examining patient and formulating plan of care.     Chaya Jan, MD [facility] at [facility]

## 2023-05-17 NOTE — Assessment & Plan Note (Signed)
On atorvastatin. Recheck lipids next visit.

## 2023-06-01 ENCOUNTER — Other Ambulatory Visit (HOSPITAL_COMMUNITY): Payer: Self-pay

## 2023-06-01 ENCOUNTER — Telehealth: Payer: Self-pay

## 2023-06-01 NOTE — Telephone Encounter (Signed)
Pharmacy Patient Advocate Encounter   Received notification from CoverMyMeds that prior authorization for Mounjaro 2.5MG /0.5ML auto-injectors is required/requested.   Insurance verification completed.   The patient is insured through [facility] .   Per test claim: Refill too soon. PA is not needed at this time. Medication was filled 05/17/2023. Next eligible fill date is 06/07/2023.

## 2023-07-05 ENCOUNTER — Other Ambulatory Visit: Payer: Self-pay | Admitting: Internal Medicine

## 2023-07-05 DIAGNOSIS — I10 Essential (primary) hypertension: Secondary | ICD-10-CM

## 2023-07-16 ENCOUNTER — Other Ambulatory Visit: Payer: Self-pay | Admitting: Internal Medicine

## 2023-07-16 DIAGNOSIS — I10 Essential (primary) hypertension: Secondary | ICD-10-CM

## 2023-07-17 ENCOUNTER — Other Ambulatory Visit (HOSPITAL_COMMUNITY): Payer: Self-pay

## 2023-07-17 ENCOUNTER — Telehealth: Payer: Self-pay

## 2023-07-17 ENCOUNTER — Telehealth: Payer: Self-pay | Admitting: Internal Medicine

## 2023-07-17 NOTE — Telephone Encounter (Signed)
 Copied from CRM [id number]. Topic: General - Other >> Jul 17, 2023  1:05 PM Mosetta Putt H wrote: Reason for CRM: insurance is requesting prior authorization for tirzepatide [facility]) 10 MG/0.5ML Pen

## 2023-07-17 NOTE — Telephone Encounter (Signed)
 PA request has been Submitted. New Encounter created for follow up. For additional info see Pharmacy Prior Auth telephone encounter from 07/17/23.

## 2023-07-17 NOTE — Telephone Encounter (Signed)
 Pharmacy Patient Advocate Encounter   Received notification from Pt Calls Messages that prior authorization for Mounjaro  10MG /0.5ML auto-injectors is required/requested.   Insurance verification completed.   The patient is insured through [facility] .   Per test claim: PA required; PA submitted to above mentioned insurance via CoverMyMeds Key/confirmation #/EOC [facility] Status is pending

## 2023-07-18 ENCOUNTER — Other Ambulatory Visit (HOSPITAL_COMMUNITY): Payer: Self-pay

## 2023-07-18 NOTE — Telephone Encounter (Signed)
 Pharmacy Patient Advocate Encounter  Received notification from [employer] that Prior Authorization for Mounjaro  10MG /0.5ML auto-injectors  has been APPROVED from 07/17/23 to 07/16/24. Unable to obtain price due to refill too soon rejection, last fill date 07/17/23 next available fill date2/4/25   PA #/Case ID/Reference #: [id number]

## 2023-07-18 NOTE — Telephone Encounter (Signed)
 Patient is aware

## 2023-07-25 ENCOUNTER — Encounter: Payer: PRIVATE HEALTH INSURANCE | Admitting: Internal Medicine

## 2023-08-07 ENCOUNTER — Other Ambulatory Visit: Payer: Self-pay | Admitting: Internal Medicine

## 2023-08-07 DIAGNOSIS — Z1231 Encounter for screening mammogram for malignant neoplasm of breast: Secondary | ICD-10-CM

## 2023-08-16 DIAGNOSIS — Z1231 Encounter for screening mammogram for malignant neoplasm of breast: Secondary | ICD-10-CM

## 2023-08-27 ENCOUNTER — Ambulatory Visit (INDEPENDENT_AMBULATORY_CARE_PROVIDER_SITE_OTHER): Payer: PRIVATE HEALTH INSURANCE | Admitting: Internal Medicine

## 2023-08-27 ENCOUNTER — Encounter: Payer: Self-pay | Admitting: Internal Medicine

## 2023-08-27 VITALS — BP 128/78 | HR 85 | Temp 97.7°F | Ht 65.0 in | Wt 167.7 lb

## 2023-08-27 DIAGNOSIS — Z1231 Encounter for screening mammogram for malignant neoplasm of breast: Secondary | ICD-10-CM

## 2023-08-27 DIAGNOSIS — I10 Essential (primary) hypertension: Secondary | ICD-10-CM

## 2023-08-27 DIAGNOSIS — Z124 Encounter for screening for malignant neoplasm of cervix: Secondary | ICD-10-CM

## 2023-08-27 DIAGNOSIS — E559 Vitamin D deficiency, unspecified: Secondary | ICD-10-CM

## 2023-08-27 DIAGNOSIS — E785 Hyperlipidemia, unspecified: Secondary | ICD-10-CM

## 2023-08-27 DIAGNOSIS — E1169 Type 2 diabetes mellitus with other specified complication: Secondary | ICD-10-CM | POA: Diagnosis not present

## 2023-08-27 DIAGNOSIS — Z1159 Encounter for screening for other viral diseases: Secondary | ICD-10-CM

## 2023-08-27 DIAGNOSIS — Z0000 Encounter for general adult medical examination without abnormal findings: Secondary | ICD-10-CM

## 2023-08-27 DIAGNOSIS — Z1211 Encounter for screening for malignant neoplasm of colon: Secondary | ICD-10-CM

## 2023-08-27 NOTE — Progress Notes (Signed)
 Established Patient Office Visit     CC/Reason for Visit: Annual preventive exam  HPI: Annette Lindsey is a 63 y.o. female who is coming in today for the above mentioned reasons. Past Medical History is significant for: Hypertension, hyperlipidemia, type 2 diabetes, history of morbid obesity with significant weight loss.  She is doing well.  She feels some decreased energy, is overdue for diabetic eye exam, has routine dental care, is due for RSV vaccine.  Is overdue for all age-appropriate cancer screening.   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 She has never used smokeless tobacco. She reports that she does not drink alcohol and does not use drugs.  Allergies: Allergies  Allergen Reactions   Other Anaphylaxis    Patient has this reaction to grass. Patient also has asthma reaction to smoke (when in closed spaces - cooking, etc.)   Shellfish Allergy Swelling    Of throat Of throat   Shellfish-Derived Products Swelling    Of throat   Lisinopril Cough    Family History:  Family History  Problem Relation Age of Onset   Heart disease Mother    Cancer Father        lung   Colon cancer Neg Hx    Esophageal cancer Neg Hx    Rectal cancer Neg Hx    Stomach cancer Neg Hx      Current Outpatient Medications:    amLODipine  (NORVASC) 10 MG tablet, Take 1 tablet (10 mg total) by mouth daily., Disp: 90 tablet, Rfl: 1   atorvastatin (LIPITOR) 80 MG tablet, Take 1 tablet (80 mg total) by mouth daily., Disp: 90 tablet, Rfl: 1   cetirizine (ZYRTEC ALLERGY) 10 MG tablet, Take 1 tablet (10 mg total) by mouth daily., Disp: 90 tablet, Rfl: 1   cyanocobalamin (,VITAMIN B-12,) 1000 MCG/ML injection, Inject 1ml in deltoid once weekly for 4 weeks, then inject 1 ml once a month thereafter, Disp: 6 mL, Rfl: 1   EPINEPHrine 0.3 mg/0.3 mL IJ SOAJ injection, Inject 0.3 mg into the muscle as needed for anaphylaxis., Disp: 1 each, Rfl: 2   hydrochlorothiazide (HYDRODIURIL) 25 MG tablet, Take 1 tablet (25 mg total) by mouth daily., Disp: 90 tablet, Rfl: 0   tirzepatide (MOUNJARO) 10 MG/0.5ML Pen, Inject 10 mg into the skin once a week., Disp: 6 mL, Rfl: 0  Review of Systems:  Negative unless indicated in HPI.   Physical Exam: Vitals:   08/27/23 1508 08/27/23 1513 08/27/23 1517  BP: (!) 140/84 130/80 128/78  Pulse: 85    Temp: 97.7 F (36.5 C)    TempSrc: Oral    Weight: 167 lb 11.2 oz (76.1 kg)    Height: 5\' 5"  (1.651 m)  Body mass index is 27.91 kg/m.   Physical Exam Vitals reviewed.  Constitutional:      General: She is not in acute distress.    Appearance: Normal appearance. She is not ill-appearing, toxic-appearing or diaphoretic.  HENT:     Head: Normocephalic.     Right Ear: Tympanic membrane, ear canal and external ear normal. There is no impacted cerumen.     Left Ear: Tympanic membrane, ear canal and external ear normal. There is no impacted cerumen.     Nose: Nose normal.     Mouth/Throat:     Mouth: Mucous membranes are moist.     Pharynx: Oropharynx is clear. No oropharyngeal exudate or posterior oropharyngeal erythema.  Eyes:     General: No scleral icterus.       Right eye: No discharge.        Left eye: No discharge.     Conjunctiva/sclera: Conjunctivae normal.     Pupils: Pupils are equal,  round, and reactive to light.  Neck:     Vascular: No carotid bruit.  Cardiovascular:     Rate and Rhythm: Normal rate and regular rhythm.     Pulses: Normal pulses.     Heart sounds: Normal heart sounds.  Pulmonary:     Effort: Pulmonary effort is normal. No respiratory distress.     Breath sounds: Normal breath sounds.  Abdominal:     General: Abdomen is flat. Bowel sounds are normal.     Palpations: Abdomen is soft.  Musculoskeletal:        General: Normal range of motion.     Cervical back: Normal range of motion.  Skin:    General: Skin is warm and dry.  Neurological:     General: No focal deficit present.     Mental Status: She is alert and oriented to person, place, and time. Mental status is at baseline.  Psychiatric:        Mood and Affect: Mood normal.        Behavior: Behavior normal.        Thought Content: Thought content normal.        Judgment: Judgment normal.     Impression and Plan:  Encounter for preventive health examination  Type 2 diabetes mellitus with other specified complication, without long-term current use of insulin (HCC) -     Hemoglobin A1c; Future -     CBC with Differential/Platelet; Future -     Comprehensive metabolic panel; Future -     Microalbumin / creatinine urine ratio; Future -     Ambulatory referral to Ophthalmology  Hyperlipidemia associated with type 2 diabetes mellitus (HCC) -     Lipid panel; Future  Primary hypertension  Morbid obesity due to excess calories (HCC) -     TSH; Future -     VITAMIN D 25 Hydroxy (Vit-D Deficiency, Fractures); Future -     Vitamin B12; Future  Vitamin D deficiency  Hyperlipidemia, unspecified hyperlipidemia type  Screening for malignant neoplasm of colon -     Ambulatory referral to Gastroenterology  Encounter for screening mammogram for malignant neoplasm of breast -     Digital Screening Mammogram, Left and Right; Future  Screening for cervical cancer -     Ambulatory referral  to Gynecology  Encounter for hepatitis C screening test for low risk patient -     Hepatitis C antibody; Future   -Recommend routine eye and dental care. -Healthy lifestyle discussed in detail. -Labs to be  updated today. -Prostate cancer screening: N/A Health Maintenance  Topic Date Due   Pneumococcal Vaccination (1 of 2 - PCV) 11/13/1968   Eye exam for diabetics  Never done   Hepatitis C Screening  Never done   Pap with HPV screening  Never done   Colon Cancer Screening  Never done   Mammogram  01/06/2016   Yearly kidney function blood test for diabetes  09/21/2022   Yearly kidney health urinalysis for diabetes  09/21/2022   COVID-19 Vaccine (5 - 2024-25 season) 03/04/2023   Hemoglobin A1C  11/14/2023   Complete foot exam   08/26/2024   DTaP/Tdap/Td vaccine (2 - Td or Tdap) 05/03/2027   Flu Shot  Completed   HIV Screening  Completed   Zoster (Shingles) Vaccine  Completed   HPV Vaccine  Aged Out     -Advised to update RSV vaccine. -Referrals for diabetic eye exam, mammogram, colonoscopy and gynecology placed.     Chaya Jan, MD [facility]

## 2023-08-28 LAB — COMPREHENSIVE METABOLIC PANEL
ALT: 14 U/L (ref 0–35)
AST: 20 U/L (ref 0–37)
Albumin: 4.1 g/dL (ref 3.5–5.2)
Alkaline Phosphatase: 57 U/L (ref 39–117)
BUN: 21 mg/dL (ref 6–23)
CO2: 31 meq/L (ref 19–32)
Calcium: 9.6 mg/dL (ref 8.4–10.5)
Chloride: 101 meq/L (ref 96–112)
Creatinine, Ser: 0.85 mg/dL (ref 0.40–1.20)
GFR: 74.27 mL/min (ref >60.00)
Glucose, Bld: 65 mg/dL — ABNORMAL LOW (ref 70–99)
Potassium: 2.9 meq/L — ABNORMAL LOW (ref 3.5–5.1)
Sodium: 141 meq/L (ref 135–145)
Total Bilirubin: 0.4 mg/dL (ref 0.2–1.2)
Total Protein: 7.8 g/dL (ref 6.0–8.3)

## 2023-08-28 LAB — CBC WITH DIFFERENTIAL/PLATELET
Basophils Absolute: 0.1 10*3/uL (ref 0.0–0.1)
Basophils Relative: 1.3 % (ref 0.0–3.0)
Eosinophils Absolute: 0.1 10*3/uL (ref 0.0–0.7)
Eosinophils Relative: 1.4 % (ref 0.0–5.0)
HCT: 36.7 % (ref 36.0–46.0)
Hemoglobin: 12.2 g/dL (ref 12.0–15.0)
Lymphocytes Relative: 49.9 % — ABNORMAL HIGH (ref 12.0–46.0)
Lymphs Abs: 2.4 10*3/uL (ref 0.7–4.0)
MCHC: 33.3 g/dL (ref 30.0–36.0)
MCV: 82.8 fl (ref 78.0–100.0)
Monocytes Absolute: 0.3 10*3/uL (ref 0.1–1.0)
Monocytes Relative: 6.9 % (ref 3.0–12.0)
Neutro Abs: 2 10*3/uL (ref 1.4–7.7)
Neutrophils Relative %: 40.5 % — ABNORMAL LOW (ref 43.0–77.0)
Platelets: 232 10*3/uL (ref 150.0–400.0)
RBC: 4.43 Mil/uL (ref 3.87–5.11)
RDW: 15.1 % (ref 11.5–15.5)
WBC: 4.9 10*3/uL (ref 4.0–10.5)

## 2023-08-28 LAB — LIPID PANEL
Cholesterol: 260 mg/dL — ABNORMAL HIGH (ref 0–200)
HDL: 51.4 mg/dL (ref >39.00)
LDL Cholesterol: 196 mg/dL — ABNORMAL HIGH (ref 0–99)
NonHDL: 208.96
Total CHOL/HDL Ratio: 5
Triglycerides: 66 mg/dL (ref 0.0–149.0)
VLDL: 13.2 mg/dL (ref 0.0–40.0)

## 2023-08-28 LAB — VITAMIN B12: Vitamin B-12: <50 pg/mL

## 2023-08-28 LAB — TSH: TSH: 1.63 u[IU]/mL (ref 0.35–5.50)

## 2023-08-28 LAB — HEMOGLOBIN A1C: Hgb A1c MFr Bld: 5.8 % (ref 4.6–6.5)

## 2023-08-28 LAB — MICROALBUMIN / CREATININE URINE RATIO
Creatinine,U: 185.5 mg/dL
Microalb Creat Ratio: 9.4 mg/g (ref 0.0–30.0)
Microalb, Ur: 1.7 mg/dL (ref 0.0–1.9)

## 2023-08-28 LAB — HEPATITIS C ANTIBODY: Hepatitis C Ab: NONREACTIVE

## 2023-08-28 LAB — VITAMIN D 25 HYDROXY (VIT D DEFICIENCY, FRACTURES): VITD: 9.53 ng/mL — ABNORMAL LOW (ref 30.00–100.00)

## 2023-09-03 ENCOUNTER — Telehealth: Payer: Self-pay

## 2023-09-03 NOTE — Telephone Encounter (Signed)
 Copied from CRM [id number]. Topic: Clinical - Request for Lab/Test Order >> Sep 03, 2023 11:39 AM Annette Lindsey wrote: Reason for CRM: Patient called in stating she needs another order diagnostics bilateral mammogram [facility]  [id number]

## 2023-09-03 NOTE — Telephone Encounter (Signed)
 Patient has scheduled an appointment for 09/04/23

## 2023-09-04 ENCOUNTER — Encounter: Payer: Self-pay | Admitting: Internal Medicine

## 2023-09-04 ENCOUNTER — Other Ambulatory Visit: Payer: Self-pay | Admitting: *Deleted

## 2023-09-04 ENCOUNTER — Telehealth (INDEPENDENT_AMBULATORY_CARE_PROVIDER_SITE_OTHER): Payer: PRIVATE HEALTH INSURANCE | Admitting: Internal Medicine

## 2023-09-04 VITALS — Wt 167.0 lb

## 2023-09-04 DIAGNOSIS — E876 Hypokalemia: Secondary | ICD-10-CM | POA: Diagnosis not present

## 2023-09-04 DIAGNOSIS — E538 Deficiency of other specified B group vitamins: Secondary | ICD-10-CM | POA: Diagnosis not present

## 2023-09-04 DIAGNOSIS — E559 Vitamin D deficiency, unspecified: Secondary | ICD-10-CM

## 2023-09-04 DIAGNOSIS — Z7985 Long-term (current) use of injectable non-insulin antidiabetic drugs: Secondary | ICD-10-CM

## 2023-09-04 DIAGNOSIS — R928 Other abnormal and inconclusive findings on diagnostic imaging of breast: Secondary | ICD-10-CM

## 2023-09-04 DIAGNOSIS — E785 Hyperlipidemia, unspecified: Secondary | ICD-10-CM

## 2023-09-04 DIAGNOSIS — E1169 Type 2 diabetes mellitus with other specified complication: Secondary | ICD-10-CM

## 2023-09-04 DIAGNOSIS — R790 Abnormal level of blood mineral: Secondary | ICD-10-CM

## 2023-09-04 MED ORDER — POTASSIUM CHLORIDE CRYS ER 20 MEQ PO TBCR: 20.0000 meq | EXTENDED_RELEASE_TABLET | Freq: Two times a day (BID) | ORAL | 0 refills | Status: DC

## 2023-09-04 MED ORDER — ATORVASTATIN CALCIUM 40 MG PO TABS: 40.0000 mg | ORAL_TABLET | Freq: Every day | ORAL | 1 refills | Status: DC

## 2023-09-04 MED ORDER — BD SAFETYGLIDE SYRINGE/NEEDLE 25G X 1" 3 ML MISC: 3 refills | Status: AC

## 2023-09-04 MED ORDER — VITAMIN D (ERGOCALCIFEROL) 1.25 MG (50000 UNIT) PO CAPS: 50000.0000 [IU] | ORAL_CAPSULE | ORAL | 0 refills | Status: DC

## 2023-09-04 MED ORDER — CYANOCOBALAMIN 1000 MCG/ML IJ SOLN: INTRAMUSCULAR | 1 refills | Status: AC

## 2023-09-04 NOTE — Progress Notes (Signed)
 Virtual Visit via Video Note  I connected with Annette Lindsey on 09/04/23 at 11:30 AM EST by a video enabled telemedicine application and verified that I am speaking with the correct person using two identifiers.  Location patient: home Location provider: work office Persons participating in the virtual visit: patient, provider  I discussed the limitations of evaluation and management by telemedicine and the availability of in person appointments. The patient expressed understanding and agreed to proceed.   HPI: She has scheduled this virtual visit to discuss the results of her recent lab work during which several abnormalities were found including severe vitamin D and B12 deficiencies, hypokalemia and significant hyperlipidemia.  She does not tolerate 80 mg of atorvastatin and is asking that we decrease to 40 mg.  She also had some calcifications on her screening mammogram and needs an order sent for diagnostic studies.   ROS: Negative unless indicated in HPI.  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   Blood transfusion without reported diagnosis    Bruises easily    Chest pain    Constipation    Diabetes mellitus without complication (HCC)    Generalized headaches    GERD (gastroesophageal reflux disease)    Hypercholesteremia    Hyperlipidemia    Hypertension    Metabolic syndrome X    Nasal congestion    Sore throat    Visual disturbance     Past Surgical History:  Procedure Laterality Date   ABDOMINAL HYSTERECTOMY  1988   MASS EXCISION  08/18/11   right buttock    Family History  Problem Relation Age of Onset   Heart disease Mother    Cancer Father        lung   Colon cancer Neg Hx    Esophageal cancer Neg Hx    Rectal cancer Neg Hx    Stomach cancer Neg Hx     SOCIAL HX:   reports that she has never smoked. She has never used smokeless tobacco. She reports that she does  not drink alcohol and does not use drugs.   Current Outpatient Medications:    amLODipine (NORVASC) 10 MG tablet, Take 1 tablet (10 mg total) by mouth daily., Disp: 90 tablet, Rfl: 1   atorvastatin (LIPITOR) 40 MG tablet, Take 1 tablet (40 mg total) by mouth daily., Disp: 90 tablet, Rfl: 1   cetirizine (ZYRTEC ALLERGY) 10 MG tablet, Take 1 tablet (10 mg total) by mouth daily., Disp: 90 tablet, Rfl: 1   cyanocobalamin (,VITAMIN B-12,) 1000 MCG/ML injection, Inject 1ml in deltoid once weekly for 4 weeks, then inject 1 ml once a month thereafter, Disp: 6 mL, Rfl: 1   EPINEPHrine 0.3 mg/0.3 mL IJ SOAJ injection, Inject 0.3 mg into the muscle as needed for anaphylaxis., Disp: 1 each, Rfl: 2   hydrochlorothiazide (HYDRODIURIL) 25 MG tablet, Take 1 tablet (25 mg total) by mouth daily., Disp: 90 tablet, Rfl: 0   potassium chloride SA (KLOR-CON M) 20 MEQ tablet, Take 1 tablet (20 mEq total) by mouth 2 (two) times daily for 7 days., Disp: 14 tablet, Rfl: 0   tirzepatide (MOUNJARO) 10 MG/0.5ML Pen, Inject 10 mg into the skin once a week., Disp: 6 mL, Rfl: 0   Vitamin D, Ergocalciferol, (DRISDOL) 1.25 MG (50000 UNIT) CAPS capsule, Take 1 capsule (50,000 Units total) by mouth every 7 (seven) days for 12 doses., Disp: 12 capsule, Rfl:  0  EXAM:   VITALS per patient if applicable: None reported  GENERAL: alert, oriented, appears well and in no acute distress  HEENT: atraumatic, conjunttiva clear, no obvious abnormalities on inspection of external nose and ears  NECK: normal movements of the head and neck  LUNGS: on inspection no signs of respiratory distress, breathing rate appears normal, no obvious gross increased work of breathing, gasping or wheezing  CV: no obvious cyanosis  MS: moves all visible extremities without noticeable abnormality  PSYCH/NEURO: pleasant and cooperative, no obvious depression or anxiety, speech and thought processing grossly intact  ASSESSMENT AND PLAN:   Vitamin D  deficiency - Plan: Vitamin D, Ergocalciferol, (DRISDOL) 1.25 MG (50000 UNIT) CAPS capsule, VITAMIN D 25 Hydroxy (Vit-D Deficiency, Fractures)  Hyperlipidemia associated with type 2 diabetes mellitus (HCC) - Plan: atorvastatin (LIPITOR) 40 MG tablet, Lipid panel  Vitamin B12 deficiency  Hypokalemia - Plan: potassium chloride SA (KLOR-CON M) 20 MEQ tablet, Basic metabolic panel  Abnormal screening mammogram  -Lab abnormalities discussed with her in detail, medications and supplementation of deficient vitamins and electrolytes has been sent.   I discussed the assessment and treatment plan with the patient. The patient was provided an opportunity to ask questions and all were answered. The patient agreed with the plan and demonstrated an understanding of the instructions.   The patient was advised to call back or seek an in-person evaluation if the symptoms worsen or if the condition fails to improve as anticipated.    Chaya Jan, MD  [facility] at [facility]

## 2023-09-04 NOTE — Progress Notes (Signed)
 Per patient no change in vitals since last visit, unable to obtain new vitals due to telehealth visit.

## 2023-09-07 ENCOUNTER — Encounter: Payer: Self-pay | Admitting: Internal Medicine

## 2023-09-10 ENCOUNTER — Other Ambulatory Visit: Payer: Self-pay | Admitting: Internal Medicine

## 2023-09-10 DIAGNOSIS — E1169 Type 2 diabetes mellitus with other specified complication: Secondary | ICD-10-CM

## 2023-09-18 ENCOUNTER — Other Ambulatory Visit: Payer: PRIVATE HEALTH INSURANCE

## 2023-09-21 ENCOUNTER — Other Ambulatory Visit (INDEPENDENT_AMBULATORY_CARE_PROVIDER_SITE_OTHER): Payer: PRIVATE HEALTH INSURANCE

## 2023-09-21 DIAGNOSIS — E1169 Type 2 diabetes mellitus with other specified complication: Secondary | ICD-10-CM

## 2023-09-21 DIAGNOSIS — E559 Vitamin D deficiency, unspecified: Secondary | ICD-10-CM

## 2023-09-21 DIAGNOSIS — R790 Abnormal level of blood mineral: Secondary | ICD-10-CM | POA: Diagnosis not present

## 2023-09-21 DIAGNOSIS — E785 Hyperlipidemia, unspecified: Secondary | ICD-10-CM

## 2023-09-21 DIAGNOSIS — E876 Hypokalemia: Secondary | ICD-10-CM

## 2023-09-21 LAB — BASIC METABOLIC PANEL
BUN: 16 mg/dL (ref 6–23)
CO2: 31 meq/L (ref 19–32)
Calcium: 9.7 mg/dL (ref 8.4–10.5)
Chloride: 99 meq/L (ref 96–112)
Creatinine, Ser: 0.72 mg/dL (ref 0.40–1.20)
GFR: 90.6 mL/min (ref >60.00)
Glucose, Bld: 84 mg/dL (ref 70–99)
Potassium: 3.5 meq/L (ref 3.5–5.1)
Sodium: 137 meq/L (ref 135–145)

## 2023-09-21 LAB — LIPID PANEL
Cholesterol: 219 mg/dL — ABNORMAL HIGH (ref 0–200)
HDL: 53.6 mg/dL (ref >39.00)
LDL Cholesterol: 155 mg/dL — ABNORMAL HIGH (ref 0–99)
NonHDL: 165.51
Total CHOL/HDL Ratio: 4
Triglycerides: 52 mg/dL (ref 0.0–149.0)
VLDL: 10.4 mg/dL (ref 0.0–40.0)

## 2023-09-21 LAB — MAGNESIUM: Magnesium: 1.9 mg/dL (ref 1.5–2.5)

## 2023-09-21 LAB — VITAMIN D 25 HYDROXY (VIT D DEFICIENCY, FRACTURES): VITD: 23.15 ng/mL — ABNORMAL LOW (ref 30.00–100.00)

## 2023-09-24 LAB — HM MAMMOGRAPHY

## 2023-09-25 ENCOUNTER — Other Ambulatory Visit: Payer: Self-pay | Admitting: *Deleted

## 2023-09-25 ENCOUNTER — Other Ambulatory Visit: Payer: Self-pay | Admitting: Internal Medicine

## 2023-09-25 ENCOUNTER — Ambulatory Visit (AMBULATORY_SURGERY_CENTER): Payer: PRIVATE HEALTH INSURANCE

## 2023-09-25 VITALS — Ht 65.0 in | Wt 165.0 lb

## 2023-09-25 DIAGNOSIS — Z1211 Encounter for screening for malignant neoplasm of colon: Secondary | ICD-10-CM

## 2023-09-25 DIAGNOSIS — E559 Vitamin D deficiency, unspecified: Secondary | ICD-10-CM

## 2023-09-25 DIAGNOSIS — E1169 Type 2 diabetes mellitus with other specified complication: Secondary | ICD-10-CM

## 2023-09-25 MED ORDER — VITAMIN D (ERGOCALCIFEROL) 1.25 MG (50000 UNIT) PO CAPS: 50000.0000 [IU] | ORAL_CAPSULE | ORAL | 0 refills | Status: AC

## 2023-09-25 MED ORDER — NA SULFATE-K SULFATE-MG SULF 17.5-3.13-1.6 GM/177ML PO SOLN: 1.0000 | Freq: Once | ORAL | 0 refills | Status: AC

## 2023-09-25 MED ORDER — ATORVASTATIN CALCIUM 80 MG PO TABS: 80.0000 mg | ORAL_TABLET | Freq: Every day | ORAL | 1 refills | Status: DC

## 2023-09-25 NOTE — Patient Instructions (Addendum)
 [facility] has implemented a new process for scheduling procedures.  Please note your arrival time for the [facility] is your appointment time that is shown on your written instructions.  Please do not arrive one hour prior to the time listed in your instructions.  Please ignore any outside notifications to arrive one hour early.  We apologize for any confusion and look forward to seeing you for your procedure.    ONCE A WEEK INJECTIONS Ozempic,  Mounjaro, Wegovy, Trulicity, Tanzeum, Byetta, Victoza, Bydureon, & SymlinPen  -DO NOT TAKE 7 days prior to the procedure.  Last dose on or before Thursday 4/3 failure to hold this medication will result in a cancellation or rescheduling of your procedure

## 2023-09-25 NOTE — Progress Notes (Signed)
 No egg or soy allergy known to patient  No issues known to pt with past sedation with any surgeries or procedures Patient denies ever being told they had issues or difficulty with intubation  No FH of Malignant Hyperthermia Pt is not on diet pills Pt is not on  home 02  Pt is not on blood thinners  Pt denies issues with constipation  No A fib or A flutter Have any cardiac testing pending-- no  LOA: independent  Prep: suprep  Patient's chart reviewed by Cathlyn Parsons CNRA prior to previsit and patient appropriate for the [facility].  Previsit completed and red dot placed by patient's name on their procedure day (on provider's schedule).     PV completed with patient. Prep instructions sent via mychart and home address.    ONCE A WEEK INJECTIONS Ozempic,  Mounjaro, Wegovy, Trulicity, Tanzeum, Byetta, Victoza, Bydureon, & SymlinPen  -DO NOT TAKE 7 days prior to the procedure.  Last dose on or before Thursday 4/3 failure to hold this medication will result in a cancellation or rescheduling of your procedure

## 2023-10-09 ENCOUNTER — Telehealth: Payer: Self-pay | Admitting: Pediatrics

## 2023-10-09 NOTE — Telephone Encounter (Signed)
 Inbound call from patient stating she has a colonoscopy scheduling for 4/10 at 10:30 and wanted to let us know that she is trying to get over a cold and is still coughing. Patient is requesting a call back to discuss. Please advise.

## 2023-10-09 NOTE — Telephone Encounter (Signed)
 Pt's name and DOB verified at the beginning of the pre-visit wit 2 identifiers Secretions are clear no fever.Instructed pt to call and reschedule if she develops a fever but as long  as secretions are clear when coughing she can do procedure but will need to wear a mask that day. Pt stated understood and had no further questions at this time.

## 2023-10-11 ENCOUNTER — Encounter: Payer: Self-pay | Admitting: Obstetrics and Gynecology

## 2023-10-11 ENCOUNTER — Encounter: Payer: Self-pay | Admitting: Pediatrics

## 2023-10-11 ENCOUNTER — Ambulatory Visit (INDEPENDENT_AMBULATORY_CARE_PROVIDER_SITE_OTHER): Payer: PRIVATE HEALTH INSURANCE | Admitting: Obstetrics and Gynecology

## 2023-10-11 VITALS — BP 118/78 | HR 68 | Ht 65.25 in | Wt 168.0 lb

## 2023-10-11 DIAGNOSIS — Z1331 Encounter for screening for depression: Secondary | ICD-10-CM | POA: Diagnosis not present

## 2023-10-11 DIAGNOSIS — N952 Postmenopausal atrophic vaginitis: Secondary | ICD-10-CM

## 2023-10-11 DIAGNOSIS — E2839 Other primary ovarian failure: Secondary | ICD-10-CM

## 2023-10-11 DIAGNOSIS — Z01419 Encounter for gynecological examination (general) (routine) without abnormal findings: Secondary | ICD-10-CM | POA: Diagnosis not present

## 2023-10-11 MED ORDER — ESTRADIOL 0.1 MG/GM VA CREA: 1.0000 | TOPICAL_CREAM | Freq: Every day | VAGINAL | 12 refills | Status: AC

## 2023-10-11 NOTE — Progress Notes (Unsigned)
 [facility] History and Physical   Primary Care Physician:  Philip Aspen, Limmie Patricia, MD   Reason for Procedure:  Colorectal cancer screening  Plan:    Screening colonoscopy   HPI: Annette Lindsey is a 63 y.o. female undergoing screening colonoscopy for colorectal cancer screening.  No family history of colorectal cancer or polyps.  Patient denies current symptoms of change in bowel habits or rectal bleeding.   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 Authorizing Provider  amLODipine (NORVASC) 10 MG tablet Take 1 tablet (10 mg total) by mouth daily. 07/09/23   Philip Aspen, Limmie Patricia, MD  atorvastatin (LIPITOR) 80 MG tablet Take 1 tablet (80 mg total) by mouth daily. 09/25/23   Philip Aspen, Limmie Patricia, MD  cetirizine (ZYRTEC ALLERGY) 10 MG tablet Take 1 tablet (10 mg total) by mouth daily. 10/04/22   Philip Aspen, Limmie Patricia, MD  cyanocobalamin (VITAMIN B12) 1000 MCG/ML injection Inject 1ml in deltoid once weekly for 4 weeks, then inject 1 ml once a month thereafter 09/04/23   Philip Aspen, Limmie Patricia, MD  EPINEPHrine 0.3 mg/0.3 mL IJ SOAJ injection Inject 0.3 mg into the muscle as needed for anaphylaxis. Patient not taking: Reported on 10/11/2023 10/19/20   Ellamae Sia, DO  estradiol (ESTRACE VAGINAL) 0.1 MG/GM vaginal cream Place 1  Applicatorful vaginally at bedtime. Rub a dime size amount into the vagina night for 2 weeks then use 3 times a week 10/11/23   Earley Favor, MD  hydrochlorothiazide (HYDRODIURIL) 25 MG tablet Take 1 tablet (25 mg total) by mouth daily. 07/16/23   Philip Aspen, Limmie Patricia, MD  MOUNJARO 10 MG/0.5ML Pen Inject 10 mg into the skin once a week. 09/10/23   Philip Aspen, Limmie Patricia, MD  SYRINGE-NEEDLE, DISP, 3 ML (BD SAFETYGLIDE SYRINGE/NEEDLE) 25G X 1" 3 ML MISC Use for B12 injections 09/04/23   Philip Aspen, Limmie Patricia, MD  Vitamin D, Ergocalciferol, (DRISDOL) 1.25 MG (50000 UNIT) CAPS capsule Take 1 capsule (50,000 Units total) by mouth every 7 (seven) days for 12 doses. 09/25/23 12/12/23  Henderson Cloud, MD    Current Outpatient Medications  Medication Sig Dispense Refill   amLODipine (NORVASC) 10 MG tablet Take 1 tablet (10 mg total) by mouth daily. 90 tablet 1   atorvastatin (LIPITOR) 80 MG tablet Take 1 tablet (80 mg total) by mouth daily. 90 tablet 1   cetirizine (ZYRTEC ALLERGY) 10 MG tablet Take 1 tablet (10 mg total) by mouth daily. 90 tablet 1   cyanocobalamin (VITAMIN B12) 1000 MCG/ML injection Inject 1ml in deltoid once weekly for 4 weeks, then inject 1 ml once a month thereafter 6 mL 1   hydrochlorothiazide (HYDRODIURIL) 25 MG tablet Take 1 tablet (25  mg total) by mouth daily. 90 tablet 0   SYRINGE-NEEDLE, DISP, 3 ML (BD SAFETYGLIDE SYRINGE/NEEDLE) 25G X 1" 3 ML MISC Use for B12 injections 100 each 3   EPINEPHrine 0.3 mg/0.3 mL IJ SOAJ injection Inject 0.3 mg into the muscle as needed for anaphylaxis. (Patient not taking: Reported on 10/11/2023) 1 each 2   estradiol (ESTRACE VAGINAL) 0.1 MG/GM vaginal cream Place 1 Applicatorful vaginally at bedtime. Rub a dime size amount into the vagina night for 2 weeks then use 3 times a week 42.5 g 12   MOUNJARO 10 MG/0.5ML Pen Inject 10 mg into the skin once a week. 6 mL 0   Vitamin D, Ergocalciferol, (DRISDOL) 1.25 MG (50000 UNIT)  CAPS capsule Take 1 capsule (50,000 Units total) by mouth every 7 (seven) days for 12 doses. 12 capsule 0   Current Facility-Administered Medications  Medication Dose Route Frequency Provider Last Rate Last Admin   0.9 %  sodium chloride infusion  500 mL Intravenous Once Coden Franchi, Durene Romans, MD        Allergies as of 10/12/2023 - Review Complete 10/12/2023  Allergen Reaction Noted   Other Anaphylaxis 07/31/2011   Lisinopril Other (See Comments) and Cough 04/23/2020   Shellfish allergy Swelling 07/31/2011    Family History  Problem Relation Age of Onset   Hypertension Mother    Diabetes Mother    Heart disease Mother    Cancer Father        lung   Hypertension Sister    Diabetes Sister    Colon cancer Neg Hx    Esophageal cancer Neg Hx    Stomach cancer Neg Hx    Rectal cancer Neg Hx     Social History   Socioeconomic History   Marital status: Single    Spouse name: Not on file   Number of children: Not on file   Years of education: Not on file   Highest education level: Master's degree (e.g., MA, MS, MEng, MEd, MSW, MBA)  Occupational History   Not on file  Tobacco Use   Smoking status: Never   Smokeless tobacco: Never  Substance and Sexual Activity   Alcohol use: No   Drug use: No   Sexual activity: Yes    Partners: Male    Birth control/protection: Surgical    Comment: hysterectomy  Other Topics Concern   Not on file  Social History Narrative   Drinks about 3 cups of caffeine daily.   Social Drivers of Corporate investment banker Strain: Low Risk  (05/17/2023)   Overall Financial Resource Strain (CARDIA)    Difficulty of Paying Living Expenses: Not very hard  Food Insecurity: No Food Insecurity (05/17/2023)   Hunger Vital Sign    Worried About Running Out of Food in the Last Year: Never true    Ran Out of Food in the Last Year: Never true  Transportation Needs: No Transportation Needs (05/17/2023)   PRAPARE - Administrator, Civil Service  (Medical): No    Lack of Transportation (Non-Medical): No  Physical Activity: Insufficiently Active (05/17/2023)   Exercise Vital Sign    Days of Exercise per Week: 3 days    Minutes of Exercise per Session: 30 min  Stress: No Stress Concern Present (05/17/2023)   [employer] of Occupational Health - Occupational Stress Questionnaire    Feeling of Stress : Not at all  Social Connections: Moderately Isolated (05/17/2023)   Social Connection and Isolation Panel [NHANES]  Frequency of Communication with Friends and Family: Once a week    Frequency of Social Gatherings with Friends and Family: Once a week    Attends Religious Services: More than 4 times per year    Active Member of [employer] or Organizations: Yes    Attends Engineer, structural: More than 4 times per year    Marital Status: Never married  Intimate Partner Violence: Not on file    Review of Systems:  All other review of systems negative except as mentioned in the HPI.  Physical Exam: Vital signs BP 127/66   Pulse 76   Temp (!) 97.3 F (36.3 C) (Temporal)   Resp 10   Ht 5\' 5"  (1.651 m)   Wt 165 lb (74.8 kg)   SpO2 98%   BMI 27.46 kg/m   General:   Alert,  Well-developed, well-nourished, pleasant and cooperative in NAD Airway:  Mallampati 3 Lungs:  Clear throughout to auscultation.   Heart:  Regular rate and rhythm; no murmurs, clicks, rubs,  or gallops. Abdomen:  Soft, nontender and nondistended. Normal bowel sounds.   Neuro/Psych:  Normal mood and affect. A and O x 3  Maren Beach, MD [facility]

## 2023-10-11 NOTE — Progress Notes (Signed)
 63 y.o. y.o. female here for New to clinic to establish for annual exam. No LMP recorded. Patient has had a hysterectomy.    NGYN,aex//jj PAP: yrs ago, MMG: 09-21-23, COLONOSCOPY: 81yrs ago, scheduled for tomorrow   Hysterectomy  in early 20's for bleeding unsure if ovaries are intact. Dxa: to get baseline with risks Colonoscopy 10/12/23  Body mass index is 27.74 kg/m.     10/11/2023   10:48 AM 09/04/2023   11:12 AM 08/27/2023    3:46 PM  Depression screen PHQ 2/9  Decreased Interest 0 0 0  Down, Depressed, Hopeless 0 0 0  PHQ - 2 Score 0 0 0  Altered sleeping   0  Tired, decreased energy   0  Change in appetite   0  Feeling bad or failure about yourself    0  Trouble concentrating   0  Moving slowly or fidgety/restless   0  Suicidal thoughts   0  PHQ-9 Score   0    Blood pressure 118/78, pulse 68, height 5' 5.25" (1.657 m), weight 168 lb (76.2 kg), SpO2 98%.  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   Blood transfusion without reported diagnosis    Bruises easily    Chest pain    Constipation    Diabetes mellitus without complication (HCC)    Generalized headaches    GERD (gastroesophageal reflux disease)    Hypercholesteremia    Hyperlipidemia    Hypertension    Metabolic syndrome X    Nasal congestion    Sore throat    Visual disturbance     Past Surgical History:  Procedure Laterality Date   ABDOMINAL HYSTERECTOMY  1988   MASS EXCISION  08/18/11   right buttock    Current Outpatient Medications on File Prior to Visit  Medication Sig Dispense Refill    amLODipine (NORVASC) 10 MG tablet Take 1 tablet (10 mg total) by mouth daily. 90 tablet 1   atorvastatin (LIPITOR) 80 MG tablet Take 1 tablet (80 mg total) by mouth daily. 90 tablet 1   cetirizine (ZYRTEC ALLERGY) 10 MG tablet Take 1 tablet (10 mg total) by mouth daily. 90 tablet 1   cyanocobalamin (VITAMIN B12) 1000 MCG/ML injection Inject 1ml in deltoid once weekly for 4 weeks, then inject 1 ml once a month thereafter 6 mL 1   hydrochlorothiazide (HYDRODIURIL) 25 MG tablet Take 1 tablet (25 mg total) by mouth daily. 90 tablet 0   MOUNJARO 10 MG/0.5ML Pen Inject 10 mg into the skin once a week. 6 mL 0   SYRINGE-NEEDLE, DISP, 3 ML (BD SAFETYGLIDE SYRINGE/NEEDLE) 25G X 1" 3 ML MISC Use for B12 injections 100 each 3   Vitamin D, Ergocalciferol, (DRISDOL) 1.25 MG (50000 UNIT) CAPS capsule Take 1 capsule (50,000 Units total) by mouth every 7 (seven) days for 12 doses. 12 capsule 0   EPINEPHrine 0.3 mg/0.3  mL IJ SOAJ injection Inject 0.3 mg into the muscle as needed for anaphylaxis. (Patient not taking: Reported on 10/11/2023) 1 each 2   No current facility-administered medications on file prior to visit.    Social History   Socioeconomic History   Marital status: Single    Spouse name: Not on file   Number of children: Not on file   Years of education: Not on file   Highest education level: Master's degree (e.g., MA, MS, MEng, MEd, MSW, MBA)  Occupational History   Not on file  Tobacco Use   Smoking status: Never   Smokeless tobacco: Never  Substance and Sexual Activity   Alcohol use: No   Drug use: No   Sexual activity: Yes    Partners: Male    Birth control/protection: Surgical    Comment: hysterectomy  Other Topics Concern   Not on file  Social History Narrative   Drinks about 3 cups of caffeine daily.   Social Drivers of Corporate investment banker Strain: Low Risk  (05/17/2023)   Overall Financial Resource Strain (CARDIA)    Difficulty of Paying Living Expenses: Not very  hard  Food Insecurity: No Food Insecurity (05/17/2023)   Hunger Vital Sign    Worried About Running Out of Food in the Last Year: Never true    Ran Out of Food in the Last Year: Never true  Transportation Needs: No Transportation Needs (05/17/2023)   PRAPARE - Administrator, Civil Service (Medical): No    Lack of Transportation (Non-Medical): No  Physical Activity: Insufficiently Active (05/17/2023)   Exercise Vital Sign    Days of Exercise per Week: 3 days    Minutes of Exercise per Session: 30 min  Stress: No Stress Concern Present (05/17/2023)   [employer] of Occupational Health - Occupational Stress Questionnaire    Feeling of Stress : Not at all  Social Connections: Moderately Isolated (05/17/2023)   Social Connection and Isolation Panel [NHANES]    Frequency of Communication with Friends and Family: Once a week    Frequency of Social Gatherings with Friends and Family: Once a week    Attends Religious Services: More than 4 times per year    Active Member of [employer] or Organizations: Yes    Attends Engineer, structural: More than 4 times per year    Marital Status: Never married  Catering manager Violence: Not on file    Family History  Problem Relation Age of Onset   Hypertension Mother    Diabetes Mother    Heart disease Mother    Cancer Father        lung   Hypertension Sister    Diabetes Sister      Allergies  Allergen Reactions   Other Anaphylaxis    Patient has this reaction to grass. Patient also has asthma reaction to smoke (when in closed spaces - cooking, etc.)   Shellfish Allergy Swelling    Of throat Of throat   Lisinopril Cough and Other (See Comments)      Patient's last menstrual period was No LMP recorded. Patient has had a hysterectomy..             Review of Systems Alls systems reviewed and are negative.     Physical Exam Constitutional:      Appearance: Normal appearance.  Genitourinary:     Vulva  normal.     No lesions in the vagina.     Right Labia: No  rash, lesions or skin changes.    Left Labia: No lesions, skin changes or rash.    Vaginal cuff intact.    No vaginal discharge or tenderness.     Anterior vaginal prolapse present.    Mild vaginal atrophy present.     Right Adnexa: not tender and no mass present.    Left Adnexa: not tender and no mass present.    Cervix is absent.     Uterus is absent.  Breasts:    Right: Normal.     Left: Normal.  HENT:     Head: Normocephalic.  Neck:     Thyroid: No thyroid mass, thyromegaly or thyroid tenderness.  Cardiovascular:     Rate and Rhythm: Normal rate and regular rhythm.     Heart sounds: Normal heart sounds, S1 normal and S2 normal.  Pulmonary:     Effort: Pulmonary effort is normal.     Breath sounds: Normal breath sounds and air entry.  Abdominal:     General: There is no distension.     Palpations: Abdomen is soft. There is no mass.     Tenderness: There is no abdominal tenderness. There is no guarding or rebound.  Musculoskeletal:        General: Normal range of motion.     Cervical back: Full passive range of motion without pain, normal range of motion and neck supple. No tenderness.     Right lower leg: No edema.     Left lower leg: No edema.  Neurological:     Mental Status: She is alert.  Skin:    General: Skin is warm.  Psychiatric:        Mood and Affect: Mood normal.        Behavior: Behavior normal.        Thought Content: Thought content normal.  Vitals and nursing note reviewed. Exam conducted with a chaperone present.       A:         Well Woman GYN exam                             P:        Pap smear not indicated Encouraged annual mammogram screening Colon cancer screening up-to-date DXA ordered today Labs and immunizations to do with PMD Discussed breast self exams Encouraged healthy lifestyle practices Encouraged Vit D and Calcium   No follow-ups on file.  Earley Favor

## 2023-10-12 ENCOUNTER — Encounter: Payer: Self-pay | Admitting: Pediatrics

## 2023-10-12 ENCOUNTER — Ambulatory Visit: Payer: PRIVATE HEALTH INSURANCE | Admitting: Pediatrics

## 2023-10-12 VITALS — BP 117/64 | HR 75 | Temp 97.3°F | Resp 11 | Ht 65.0 in | Wt 165.0 lb

## 2023-10-12 DIAGNOSIS — K648 Other hemorrhoids: Secondary | ICD-10-CM

## 2023-10-12 DIAGNOSIS — K5730 Diverticulosis of large intestine without perforation or abscess without bleeding: Secondary | ICD-10-CM | POA: Diagnosis not present

## 2023-10-12 DIAGNOSIS — Z1211 Encounter for screening for malignant neoplasm of colon: Secondary | ICD-10-CM

## 2023-10-12 MED ORDER — SODIUM CHLORIDE 0.9 % IV SOLN: 500.0000 mL | Freq: Once | INTRAVENOUS | Status: DC

## 2023-10-12 NOTE — Op Note (Signed)
 [facility] Patient Name: Annette Lindsey Procedure Date: 10/12/2023 11:10 AM MRN: [id number] Endoscopist: Maren Beach , MD, [id number] Age: 63 Referring MD:  Date of Birth: [birth year] Gender: Female Account #: [id number] Procedure:                Colonoscopy Indications:              Screening for colorectal malignant neoplasm, Last                            colonoscopy: 2010 Medicines:                Monitored Anesthesia Care Procedure:                Pre-Anesthesia Assessment:                           - Prior to the procedure, a History and Physical                            was performed, and patient medications and                            allergies were reviewed. The patient's tolerance of                            previous anesthesia was also reviewed. The risks                            and benefits of the procedure and the sedation                            options and risks were discussed with the patient.                            All questions were answered, and informed consent                            was obtained. Prior Anticoagulants: The patient has                            taken no anticoagulant or antiplatelet agents. ASA                            Grade Assessment: II - A patient with mild systemic                            disease. After reviewing the risks and benefits,                            the patient was deemed in satisfactory condition to                            undergo the procedure.  After obtaining informed consent, the colonoscope                            was passed under direct vision. Throughout the                            procedure, the patient's blood pressure, pulse, and                            oxygen saturations were monitored continuously. The                            Olympus CF-HQ190L ([id number]) Colonoscope was                            introduced through the anus with the  intention of                            advancing to the cecum. The scope was advanced to                            the transverse colon before the procedure was                            aborted. Medications were given. The colonoscopy                            was performed without difficulty. The patient                            tolerated the procedure well. The quality of the                            bowel preparation was unsatisfactory. Scope In: 11:15:26 AM Scope Out: 11:24:52 AM Total Procedure Duration: 0 hours 9 minutes 26 seconds  Findings:                 Hemorrhoids were found on perianal exam.                           The digital rectal exam was normal. Pertinent                            negatives include normal sphincter tone and no                            palpable rectal lesions.                           A large amount of semi-liquid stool was found in                            the recto-sigmoid colon, in the descending colon  and in the transverse colon, precluding                            visualization. Lavage of the area was performed                            using sterile water, resulting in incomplete                            clearance with continued poor visualization. Stool                            throughout the colon impaired the ability to                            visualize the colon lumen to safely guide the                            colonoscope and also obscured pathology and polyps.                            As such, the decision is made to abort the                            colonoscopy in the transverse colon due to poor                            bowel preparation.                           Multiple medium-mouthed and small-mouthed                            diverticula were found in the sigmoid colon and                            descending colon.                           Internal hemorrhoids were  found during retroflexion. Complications:            No immediate complications. Estimated blood loss:                            None. Estimated Blood Loss:     Estimated blood loss: none. Impression:               - Preparation of the colon was unsatisfactory.                           - Hemorrhoids found on perianal exam.                           - Stool in the recto-sigmoid colon, in the  descending colon and in the transverse colon.                           - Diverticulosis in the sigmoid colon and in the                            descending colon.                           - Internal hemorrhoids.                           - No specimens collected. Recommendation:           - Discharge patient to home (ambulatory).                           - Repeat colonoscopy at the next available                            appointment because the bowel preparation was                            suboptimal. Recommend a 2-day bowel prep prior to                            next colonoscopy as well as holding GLP-1                            medication for appropriate timeframe.                           - The findings and recommendations were discussed                            with the patient's family.                           - Return to referring physician.                           - Patient has a contact number available for                            emergencies. The signs and symptoms of potential                            delayed complications were discussed with the                            patient. Return to normal activities tomorrow.                            Written discharge instructions were provided to the  patient. Maren Beach, MD 10/12/2023 11:33:26 AM This report has been signed electronically.

## 2023-10-12 NOTE — Progress Notes (Signed)
To PACU, VSS. Report to [email or web address] 

## 2023-10-12 NOTE — Progress Notes (Signed)
 Recall placed due to poor prep. ID #[id number]

## 2023-10-12 NOTE — Patient Instructions (Addendum)
-  Handout on hemorrhoids and diverticulosis provided  -Continue present medications    YOU HAD AN ENDOSCOPIC PROCEDURE TODAY AT THE [facility]:   Refer to the procedure report that was given to you for any specific questions about what was found during the examination.  If the procedure report does not answer your questions, please call your gastroenterologist to clarify.  If you requested that your care partner not be given the details of your procedure findings, then the procedure report has been included in a sealed envelope for you to review at your convenience later.  YOU SHOULD EXPECT: Some feelings of bloating in the abdomen. Passage of more gas than usual.  Walking can help get rid of the air that was put into your GI tract during the procedure and reduce the bloating. If you had a lower endoscopy (such as a colonoscopy or flexible sigmoidoscopy) you may notice spotting of blood in your stool or on the toilet paper. If you underwent a bowel prep for your procedure, you may not have a normal bowel movement for a few days.  Please Note:  You might notice some irritation and congestion in your nose or some drainage.  This is from the oxygen used during your procedure.  There is no need for concern and it should clear up in a day or so.  SYMPTOMS TO REPORT IMMEDIATELY:  Following lower endoscopy (colonoscopy or flexible sigmoidoscopy):  Excessive amounts of blood in the stool  Significant tenderness or worsening of abdominal pains  Swelling of the abdomen that is new, acute  Fever of 100F or higher   For urgent or emergent issues, a gastroenterologist can be reached at any hour by calling [phone]. Do not use MyChart messaging for urgent concerns.    DIET:  We do recommend a small meal at first, but then you may proceed to your regular diet.  Drink plenty of fluids but you should avoid alcoholic beverages for 24 hours.  ACTIVITY:  You should plan to take it easy for  the rest of today and you should NOT DRIVE or use heavy machinery until tomorrow (because of the sedation medicines used during the test).    FOLLOW UP: Our staff will call the number listed on your records the next business day following your procedure.  We will call around 7:15- 8:00 am to check on you and address any questions or concerns that you may have regarding the information given to you following your procedure. If we do not reach you, we will leave a message.     If any biopsies were taken you will be contacted by phone or by letter within the next 1-3 weeks.  Please call us at [phone] if you have not heard about the biopsies in 3 weeks.    SIGNATURES/CONFIDENTIALITY: You and/or your care partner have signed paperwork which will be entered into your electronic medical record.  These signatures attest to the fact that that the information above on your After Visit Summary has been reviewed and is understood.  Full responsibility of the confidentiality of this discharge information lies with you and/or your care-partner.

## 2023-10-16 ENCOUNTER — Telehealth: Payer: Self-pay

## 2023-10-16 NOTE — Telephone Encounter (Signed)
  Follow up Call-     10/12/2023   10:14 AM  Call back number  Post procedure Call Back phone  # [phone]  Permission to leave phone message Yes     Patient questions:  Do you have a fever, pain , or abdominal swelling? No. Pain Score  0 *  Have you tolerated food without any problems? Yes.    Have you been able to return to your normal activities? Yes.    Do you have any questions about your discharge instructions: Diet   No. Medications  No. Follow up visit  No.  Do you have questions or concerns about your Care? No.  Actions: * If pain score is 4 or above: No action needed, pain <4.

## 2023-11-09 ENCOUNTER — Other Ambulatory Visit: Payer: Self-pay | Admitting: Internal Medicine

## 2023-11-09 DIAGNOSIS — I10 Essential (primary) hypertension: Secondary | ICD-10-CM

## 2023-11-29 ENCOUNTER — Telehealth: Payer: Self-pay | Admitting: Internal Medicine

## 2023-11-29 NOTE — Telephone Encounter (Signed)
 Copied from CRM [id number]. Topic: Clinical - Medication Question >> Nov 29, 2023  4:43 PM Clyde Darling P wrote: Reason for CRM: Pt would like to know if MOUNJARO  10 MG/0.5ML Pen dosage be increased. Pt can be reached [phone]

## 2023-11-30 NOTE — Telephone Encounter (Signed)
 Patient is calling back in to see if her mounjaro  can be increased she would like a call back regarding this

## 2023-12-03 NOTE — Telephone Encounter (Signed)
 Left detailed message on machine for patient to call and schedule an office visit.

## 2023-12-10 ENCOUNTER — Other Ambulatory Visit: Payer: Self-pay | Admitting: Internal Medicine

## 2023-12-10 DIAGNOSIS — E1169 Type 2 diabetes mellitus with other specified complication: Secondary | ICD-10-CM
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
## 2023-12-26 ENCOUNTER — Other Ambulatory Visit (INDEPENDENT_AMBULATORY_CARE_PROVIDER_SITE_OTHER): Payer: PRIVATE HEALTH INSURANCE

## 2023-12-26 ENCOUNTER — Other Ambulatory Visit: Payer: PRIVATE HEALTH INSURANCE

## 2023-12-26 DIAGNOSIS — E1169 Type 2 diabetes mellitus with other specified complication: Secondary | ICD-10-CM | POA: Diagnosis not present

## 2023-12-26 DIAGNOSIS — E785 Hyperlipidemia, unspecified: Secondary | ICD-10-CM | POA: Diagnosis not present

## 2023-12-26 DIAGNOSIS — E559 Vitamin D deficiency, unspecified: Secondary | ICD-10-CM

## 2023-12-27 ENCOUNTER — Ambulatory Visit: Payer: PRIVATE HEALTH INSURANCE | Admitting: Internal Medicine

## 2023-12-27 ENCOUNTER — Ambulatory Visit: Payer: Self-pay | Admitting: Internal Medicine

## 2023-12-27 DIAGNOSIS — E1169 Type 2 diabetes mellitus with other specified complication: Secondary | ICD-10-CM

## 2023-12-27 LAB — LIPID PANEL
Cholesterol: 191 mg/dL (ref 0–200)
HDL: 52.3 mg/dL (ref >39.00)
LDL Cholesterol: 125 mg/dL — ABNORMAL HIGH (ref 0–99)
NonHDL: 138.82
Total CHOL/HDL Ratio: 4
Triglycerides: 69 mg/dL (ref 0.0–149.0)
VLDL: 13.8 mg/dL (ref 0.0–40.0)

## 2023-12-27 LAB — VITAMIN D 25 HYDROXY (VIT D DEFICIENCY, FRACTURES): VITD: 33.75 ng/mL (ref 30.00–100.00)

## 2024-01-02 ENCOUNTER — Encounter: Payer: Self-pay | Admitting: Internal Medicine

## 2024-01-02 ENCOUNTER — Ambulatory Visit: Payer: PRIVATE HEALTH INSURANCE | Admitting: Internal Medicine

## 2024-01-02 VITALS — BP 130/86 | HR 70 | Temp 97.7°F | Wt 162.2 lb

## 2024-01-02 DIAGNOSIS — E1169 Type 2 diabetes mellitus with other specified complication: Secondary | ICD-10-CM | POA: Diagnosis not present

## 2024-01-02 DIAGNOSIS — Z7985 Long-term (current) use of injectable non-insulin antidiabetic drugs: Secondary | ICD-10-CM | POA: Diagnosis not present

## 2024-01-02 LAB — POCT GLYCOSYLATED HEMOGLOBIN (HGB A1C): Hemoglobin A1C: 5.5 % (ref 4.0–5.6)
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
## 2024-01-25 ENCOUNTER — Other Ambulatory Visit: Payer: Self-pay | Admitting: Internal Medicine

## 2024-01-25 DIAGNOSIS — I10 Essential (primary) hypertension: Secondary | ICD-10-CM

## 2024-02-21 ENCOUNTER — Emergency Department (HOSPITAL_COMMUNITY)
Admission: EM | Admit: 2024-02-21 | Discharge: 2024-02-21 | Disposition: A | Discharge status: Home / Self Care | Payer: PRIVATE HEALTH INSURANCE | Attending: Emergency Medicine | Admitting: Emergency Medicine

## 2024-02-21 ENCOUNTER — Encounter (HOSPITAL_COMMUNITY): Payer: Self-pay

## 2024-02-21 ENCOUNTER — Other Ambulatory Visit: Payer: Self-pay

## 2024-02-21 ENCOUNTER — Emergency Department (HOSPITAL_COMMUNITY): Payer: PRIVATE HEALTH INSURANCE

## 2024-02-21 DIAGNOSIS — J45909 Unspecified asthma, uncomplicated: Secondary | ICD-10-CM | POA: Diagnosis not present

## 2024-02-21 DIAGNOSIS — Z79899 Other long term (current) drug therapy: Secondary | ICD-10-CM | POA: Diagnosis not present

## 2024-02-21 DIAGNOSIS — I10 Essential (primary) hypertension: Secondary | ICD-10-CM | POA: Insufficient documentation

## 2024-02-21 DIAGNOSIS — E876 Hypokalemia: Secondary | ICD-10-CM | POA: Insufficient documentation

## 2024-02-21 DIAGNOSIS — E119 Type 2 diabetes mellitus without complications: Secondary | ICD-10-CM | POA: Insufficient documentation

## 2024-02-21 DIAGNOSIS — R55 Syncope and collapse: Secondary | ICD-10-CM | POA: Insufficient documentation

## 2024-02-21 LAB — COMPREHENSIVE METABOLIC PANEL WITH GFR
ALT: 23 U/L (ref 0–44)
AST: 23 U/L (ref 15–41)
Albumin: 4 g/dL (ref 3.5–5.0)
Alkaline Phosphatase: 58 U/L (ref 38–126)
Anion gap: 10 (ref 5–15)
BUN: 24 mg/dL — ABNORMAL HIGH (ref 8–23)
CO2: 27 mmol/L (ref 22–32)
Calcium: 9.4 mg/dL (ref 8.9–10.3)
Chloride: 100 mmol/L (ref 98–111)
Creatinine, Ser: 0.85 mg/dL (ref 0.44–1.00)
GFR, Estimated: >60 mL/min (ref >60)
Glucose, Bld: 87 mg/dL (ref 70–99)
Potassium: 2.9 mmol/L — ABNORMAL LOW (ref 3.5–5.1)
Sodium: 137 mmol/L (ref 135–145)
Total Bilirubin: 0.9 mg/dL (ref 0.0–1.2)
Total Protein: 7.6 g/dL (ref 6.5–8.1)

## 2024-02-21 LAB — CBC
HCT: 36.2 % (ref 36.0–46.0)
Hemoglobin: 12 g/dL (ref 12.0–15.0)
MCH: 28.5 pg (ref 26.0–34.0)
MCHC: 33.1 g/dL (ref 30.0–36.0)
MCV: 86 fL (ref 80.0–100.0)
Platelets: 217 K/uL (ref 150–400)
RBC: 4.21 MIL/uL (ref 3.87–5.11)
RDW: 14 % (ref 11.5–15.5)
WBC: 4.4 K/uL (ref 4.0–10.5)
nRBC: 0 % (ref 0.0–0.2)

## 2024-02-21 LAB — URINALYSIS, ROUTINE W REFLEX MICROSCOPIC
Bilirubin Urine: NEGATIVE
Glucose, UA: NEGATIVE mg/dL
Hgb urine dipstick: NEGATIVE
Ketones, ur: NEGATIVE mg/dL
Nitrite: NEGATIVE
Protein, ur: NEGATIVE mg/dL
Specific Gravity, Urine: 1.006 (ref 1.005–1.030)
pH: 7 (ref 5.0–8.0)

## 2024-02-21 LAB — CBG MONITORING, ED: Glucose-Capillary: 79 mg/dL (ref 70–99)

## 2024-02-21 LAB — MAGNESIUM: Magnesium: 2.2 mg/dL (ref 1.7–2.4)

## 2024-02-21 MED ORDER — LACTATED RINGERS IV BOLUS
2000.0000 mL | Freq: Once | INTRAVENOUS | Status: AC
  Administered 2024-02-21: 2000 mL via INTRAVENOUS

## 2024-02-21 MED ORDER — MAGNESIUM SULFATE 2 GM/50ML IV SOLN
2.0000 g | Freq: Once | INTRAVENOUS | Status: AC
  Administered 2024-02-21: 2 g via INTRAVENOUS
  Filled 2024-02-21: qty 50

## 2024-02-21 MED ORDER — POTASSIUM CHLORIDE CRYS ER 20 MEQ PO TBCR
40.0000 meq | EXTENDED_RELEASE_TABLET | Freq: Once | ORAL | Status: AC
  Administered 2024-02-21: 40 meq via ORAL
  Filled 2024-02-21: qty 2

## 2024-02-21 MED ORDER — KETOROLAC TROMETHAMINE 15 MG/ML IJ SOLN
15.0000 mg | Freq: Once | INTRAMUSCULAR | Status: AC
  Administered 2024-02-21: 15 mg via INTRAVENOUS
  Filled 2024-02-21: qty 1

## 2024-02-21 NOTE — ED Triage Notes (Signed)
 Patient said she felt light headed and almost passed out at work today. Sat down and felt a little better. Has a headache. Feels nauseous.
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
## 2024-02-21 NOTE — Discharge Instructions (Addendum)
 Yesterday increase your water intake.  Cut back on your caffeine intake.  CT scan without any concerning findings.  Return for any emergent symptoms.  Follow-up with your primary care doctor.
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
## 2024-02-22 ENCOUNTER — Other Ambulatory Visit: Payer: Self-pay

## 2024-02-22 ENCOUNTER — Telehealth: Payer: Self-pay

## 2024-02-22 DIAGNOSIS — E1169 Type 2 diabetes mellitus with other specified complication: Secondary | ICD-10-CM

## 2024-02-22 MED ORDER — MOUNJARO 10 MG/0.5ML ~~LOC~~ SOAJ: SUBCUTANEOUS | 0 refills | Status: DC

## 2024-02-22 NOTE — Transitions of Care (Post Inpatient/ED Visit) (Signed)
   02/22/2024  Name: Annette Lindsey MRN: [id number] DOB: July 18, 1960  Today's TOC FU Call Status: Today's TOC FU Call Status:: Successful TOC FU Call Completed TOC FU Call Complete Date: 02/22/24 Patient's Name and Date of Birth confirmed.  Transition Care Management Follow-up Telephone Call Date of Discharge: 02/21/24 Discharge Facility: [facility]) Type of Discharge: Emergency Department Reason for ED Visit: Other: (near syncope) How have you been since you were released from the hospital?: Better Any questions or concerns?: No  Items Reviewed: Did you receive and understand the discharge instructions provided?: Yes Medications obtained,verified, and reconciled?: Yes (Medications Reviewed) Any new allergies since your discharge?: No Dietary orders reviewed?: No Do you have support at home?: Yes Name of Support/Comfort Primary Source: house maid  Medications Reviewed Today: Medications Reviewed Today   Medications were not reviewed in this encounter     Home Care and Equipment/Supplies: Were Home Health Services Ordered?: No Any new equipment or medical supplies ordered?: No  Functional Questionnaire: Do you need assistance with bathing/showering or dressing?: Yes Do you need assistance with meal preparation?: Yes Do you need assistance with eating?: Yes Do you have difficulty maintaining continence: Yes Do you need assistance with getting out of bed/getting out of a chair/moving?: Yes Do you have difficulty managing or taking your medications?: Yes  Follow up appointments reviewed: PCP Follow-up appointment confirmed?: Yes Date of PCP follow-up appointment?: 03/04/24 Follow-up Provider: Dr. Theophilus [place]: Willeen Craver, CMA

## 2024-02-22 NOTE — Telephone Encounter (Signed)
[place]

## 2024-02-26 ENCOUNTER — Other Ambulatory Visit: Payer: Self-pay | Admitting: Internal Medicine

## 2024-02-26 DIAGNOSIS — I10 Essential (primary) hypertension: Secondary | ICD-10-CM

## 2024-03-04 ENCOUNTER — Inpatient Hospital Stay: Payer: PRIVATE HEALTH INSURANCE | Admitting: Internal Medicine

## 2024-03-07 ENCOUNTER — Ambulatory Visit (INDEPENDENT_AMBULATORY_CARE_PROVIDER_SITE_OTHER): Payer: PRIVATE HEALTH INSURANCE | Admitting: Adult Health

## 2024-03-07 ENCOUNTER — Encounter: Payer: Self-pay | Admitting: Adult Health

## 2024-03-07 VITALS — BP 100/70 | HR 65 | Temp 98.3°F | Ht 66.0 in | Wt 164.0 lb

## 2024-03-07 DIAGNOSIS — E876 Hypokalemia: Secondary | ICD-10-CM | POA: Diagnosis not present

## 2024-03-07 DIAGNOSIS — R5383 Other fatigue: Secondary | ICD-10-CM | POA: Diagnosis not present

## 2024-03-07 NOTE — Progress Notes (Signed)
 Subjective:    Patient ID: Annette Lindsey, female    DOB: March 30, 1961, 63 y.o.   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  She is a patient of Dr. Theophilus who I am seeing today for follow-up after being seen in the emergency room on 02/21/2024.  She presented to the ER with a concern of lightheadedness that occurred while she was at work.  She did not pass out.  She denied chest pain, shortness of breath or other prodromal symptoms.  Her symptoms did improve after she sat down.  She reports that she was involved in DC at the end of May and since then has been having frequent headaches.  She has a history of migraines and typically does not get headaches.  She reported not drinking significant water a week she may finish 1-2 bottles of 16 ounce water bottles.    She was not orthostatic ER.  CBC was unremarkable.  CMP showed a potassium of 2.9 BUN 24.  She did have potassium repletion done.  Urinalysis did not show a UTI.  CT of the head showed no acute intracranial abnormality.  She felt better after IV hydration and potassium was replenished.  She was advised to follow-up to have her potassium rechecked.  Today she reports that she has been trying to stay hydrated. She has not had any more symptoms but does feel fatigued. She has a history of B12 deficiency and has not had her B12 injection in some time. She is also on hydrochlorothiazide  and her potassium has been low/low normal in the past.      Review of Systems See HPI   Past Medical History:  Diagnosis Date    Allergy    Asthma    Atypical fibroxanthoma right buttock 07/31/2011   Blood transfusion without reported diagnosis    Bruises easily    Chest pain    Constipation    Diabetes mellitus without complication (HCC)    Generalized headaches    GERD (gastroesophageal reflux disease)    Hypercholesteremia    Hyperlipidemia    Hypertension    Metabolic syndrome X    Nasal congestion    Sore throat    Visual disturbance     Social History   Socioeconomic History   Marital status: Single    Spouse name: Not on file   Number of children: Not on file   Years of education: Not on file   Highest education level: Master's degree (e.g., MA, MS, MEng, MEd, MSW, MBA)  Occupational History   Not on file  Tobacco Use   Smoking status: Never   Smokeless tobacco: Never  Substance and Sexual Activity   Alcohol use: No   Drug use: No   Sexual activity: Yes    Partners: Male    Birth control/protection: Surgical    Comment: hysterectomy  Other Topics Concern   Not on file  Social History Narrative   Drinks about 3 cups of caffeine daily.   Social Drivers of Corporate investment banker Strain: Low Risk  (05/17/2023)   Overall Financial Resource Strain (CARDIA)    Difficulty of Paying Living Expenses: Not very hard  Food Insecurity: No  Food Insecurity (05/17/2023)   Hunger Vital Sign    Worried About Running Out of Food in the Last Year: Never true    Ran Out of Food in the Last Year: Never true  Transportation Needs: No Transportation Needs (05/17/2023)   PRAPARE - Administrator, Civil Service (Medical): No    Lack of Transportation (Non-Medical): No  Physical Activity: Insufficiently Active (05/17/2023)   Exercise Vital Sign    Days of Exercise per Week: 3 days    Minutes of Exercise per Session: 30 min  Stress: No Stress Concern Present (05/17/2023)   [employer] of Occupational Health - Occupational Stress Questionnaire    Feeling of Stress : Not at all   Social Connections: Moderately Isolated (05/17/2023)   Social Connection and Isolation Panel    Frequency of Communication with Friends and Family: Once a week    Frequency of Social Gatherings with Friends and Family: Once a week    Attends Religious Services: More than 4 times per year    Active Member of [employer] or Organizations: Yes    Attends Engineer, structural: More than 4 times per year    Marital Status: Never married  Intimate Partner Violence: Not on file    Past Surgical History:  Procedure Laterality Date   ABDOMINAL HYSTERECTOMY  1988   MASS EXCISION  08/18/11   right buttock    Family History  Problem Relation Age of Onset   Hypertension Mother    Diabetes Mother    Heart disease Mother    Cancer Father        lung   Hypertension Sister    Diabetes Sister    Colon cancer Neg Hx    Esophageal cancer Neg Hx    Stomach cancer Neg Hx    Rectal cancer Neg Hx     Allergies  Allergen Reactions   Other Anaphylaxis    Patient has this reaction to grass. Patient also has asthma reaction to smoke (when in closed spaces - cooking, etc.)   Lisinopril  Other (See Comments) and Cough   Shellfish Allergy Swelling    Of throat Of throat    Current Outpatient Medications on File Prior to Visit  Medication Sig Dispense Refill   amLODipine  (NORVASC ) 10 MG tablet Take 1 tablet (10 mg total) by mouth daily. 90 tablet 1   atorvastatin  (LIPITOR) 80 MG tablet Take 1 tablet (80 mg total) by mouth daily. 90 tablet 1   cetirizine  (ZYRTEC  ALLERGY) 10 MG tablet Take 1 tablet (10 mg total) by mouth daily. 90 tablet 1   cyanocobalamin  (VITAMIN B12) 1000 MCG/ML injection Inject 1ml in deltoid once weekly for 4 weeks, then inject 1 ml once a month thereafter 6 mL 1   EPINEPHrine  0.3 mg/0.3 mL IJ SOAJ injection Inject 0.3 mg into the muscle as needed for anaphylaxis. 1 each 2   estradiol  (ESTRACE  VAGINAL) 0.1 MG/GM vaginal cream Place 1 Applicatorful vaginally at bedtime. Rub a  dime size amount into the vagina night for 2 weeks then use 3 times a week 42.5 g 12   hydrochlorothiazide  (HYDRODIURIL ) 25 MG tablet Take 1 tablet (25 mg total) by mouth daily. 90 tablet 1   SYRINGE-NEEDLE, DISP, 3 ML (BD SAFETYGLIDE SYRINGE/NEEDLE) 25G X 1 3 ML MISC Use for B12 injections 100 each 3   tirzepatide  (MOUNJARO ) 10 MG/0.5ML Pen Inject the content of 1 pen into the skin once a week. 6 mL 0   No current facility-administered  medications on file prior to visit.    BP 100/70   Pulse 65   Temp 98.3 F (36.8 C) (Oral)   Ht 5' 6 (1.676 m)   Wt 164 lb (74.4 kg)   SpO2 99%   BMI 26.47 kg/m       Objective:   Physical Exam Vitals and nursing note reviewed.  Constitutional:      Appearance: Normal appearance.  Cardiovascular:     Rate and Rhythm: Normal rate and regular rhythm.     Pulses: Normal pulses.     Heart sounds: Normal heart sounds.  Pulmonary:     Effort: Pulmonary effort is normal.  Musculoskeletal:        General: Normal range of motion.  Skin:    General: Skin is warm and dry.  Neurological:     General: No focal deficit present.     Mental Status: She is alert and oriented to person, place, and time.  Psychiatric:        Mood and Affect: Mood normal.        Behavior: Behavior normal.        Thought Content: Thought content normal.        Judgment: Judgment normal.       Assessment & Plan:  1. Hypokalemia (Primary) - Consider adding daily potassium supplement since she is on hydrochlorothiazide  - Stay hydrated - Basic Metabolic Panel; Future  2. Other fatigue - Consider adding back B12 supplements  - Vitamin B12; Future - Basic Metabolic Panel; Future  Darleene Shape, NP

## 2024-03-08 LAB — BASIC METABOLIC PANEL WITH GFR
BUN/Creatinine Ratio: 18 (ref 12–28)
BUN: 18 mg/dL (ref 8–27)
CO2: 28 mmol/L (ref 20–29)
Calcium: 9.3 mg/dL (ref 8.7–10.3)
Chloride: 102 mmol/L (ref 96–106)
Creatinine, Ser: 1 mg/dL (ref 0.57–1.00)
Glucose: 80 mg/dL (ref 70–99)
Potassium: 3.2 mmol/L — ABNORMAL LOW (ref 3.5–5.2)
Sodium: 141 mmol/L (ref 134–144)
eGFR: 64 mL/min/1.73 (ref >59)

## 2024-03-08 LAB — VITAMIN B12: Vitamin B-12: 176 pg/mL — ABNORMAL LOW (ref 232–1245)

## 2024-03-11 ENCOUNTER — Ambulatory Visit: Payer: Self-pay | Admitting: Adult Health

## 2024-03-14 MED ORDER — POTASSIUM CHLORIDE CRYS ER 20 MEQ PO TBCR: 20.0000 meq | EXTENDED_RELEASE_TABLET | Freq: Two times a day (BID) | ORAL | 0 refills | Status: AC

## 2024-05-02 ENCOUNTER — Telehealth: Payer: Self-pay | Admitting: Internal Medicine

## 2024-05-02 NOTE — Telephone Encounter (Signed)
 Copied from CRM #[id number]. Topic: Clinical - Medication Question >> May 02, 2024  8:17 AM [name] wrote: Reason for CRM: Patient is calling asking if provider can increase her tirzepatide  (MOUNJARO ) 10 MG/0.5ML Pen to the 12.5mg .  Annette Lindsey [id number]

## 2024-05-05 ENCOUNTER — Other Ambulatory Visit: Payer: Self-pay | Admitting: Internal Medicine

## 2024-05-05 DIAGNOSIS — E1169 Type 2 diabetes mellitus with other specified complication: Secondary | ICD-10-CM

## 2024-05-05 MED ORDER — TIRZEPATIDE 12.5 MG/0.5ML ~~LOC~~ SOAJ: 12.5000 mg | SUBCUTANEOUS | 0 refills | Status: DC

## 2024-05-06 NOTE — Telephone Encounter (Signed)
 Reach out to pt. Pt states she already aware about the medication sent in.

## 2024-05-20 LAB — OPHTHALMOLOGY REPORT-SCANNED

## 2024-06-02 ENCOUNTER — Other Ambulatory Visit: Payer: Self-pay | Admitting: Internal Medicine

## 2024-06-02 DIAGNOSIS — E1169 Type 2 diabetes mellitus with other specified complication: Secondary | ICD-10-CM

## 2024-06-19 ENCOUNTER — Telehealth: Payer: Self-pay

## 2024-06-19 ENCOUNTER — Other Ambulatory Visit (HOSPITAL_COMMUNITY): Payer: Self-pay

## 2024-06-19 NOTE — Telephone Encounter (Signed)
 Pharmacy Patient Advocate Encounter   Received notification from Onbase that prior authorization for Mounjaro  12.5 is required/requested.   Insurance verification completed.   The patient is insured through [employer].   Per test claim: Refill too soon. PA is not needed at this time. Medication was filled 06/02/24. Next eligible fill date is 06/23/24.  Current PA expires 07/16/24

## 2024-06-25 ENCOUNTER — Telehealth: Payer: Self-pay | Admitting: Pharmacy Technician

## 2024-06-25 ENCOUNTER — Other Ambulatory Visit (HOSPITAL_COMMUNITY): Payer: Self-pay

## 2024-06-25 NOTE — Telephone Encounter (Signed)
 Pharmacy Patient Advocate Encounter  Received notification from [facility] that Prior Authorization for Mounjaro  12.5MG /0.5ML auto-injectors  is no longer needed.   PA #/Case ID/Reference #: [id number]

## 2024-06-25 NOTE — Telephone Encounter (Signed)
 Pharmacy Patient Advocate Encounter   Received notification from Onbase that prior authorization for Mounjaro  10MG /0.5ML auto-injectors is due for renewal.   Insurance verification completed.   The patient is insured through [facility].  Action: PA required; PA started via CoverMyMeds. KEY [id number] . Waiting for clinical questions to populate.

## 2024-06-30 ENCOUNTER — Other Ambulatory Visit: Payer: Self-pay | Admitting: Internal Medicine

## 2024-06-30 DIAGNOSIS — E1169 Type 2 diabetes mellitus with other specified complication: Secondary | ICD-10-CM

## 2024-06-30 NOTE — Telephone Encounter (Signed)
 Contacted pharmacy. They inform pt had pick up Rx on 12/1. Rx went through.

## 2024-07-30 ENCOUNTER — Other Ambulatory Visit: Payer: Self-pay | Admitting: Internal Medicine

## 2024-07-30 DIAGNOSIS — E1169 Type 2 diabetes mellitus with other specified complication: Secondary | ICD-10-CM

## 2024-07-30 DIAGNOSIS — I10 Essential (primary) hypertension: Secondary | ICD-10-CM

## 2024-08-01 ENCOUNTER — Other Ambulatory Visit (HOSPITAL_COMMUNITY): Payer: Self-pay

## 2024-08-01 ENCOUNTER — Telehealth: Payer: Self-pay

## 2024-08-01 NOTE — Telephone Encounter (Signed)
 Pharmacy Patient Advocate Encounter   Received notification from [facility] KEY that prior authorization for Mounjaro  12.5 is required/requested.   Insurance verification completed.   The patient is insured through [employer].   Per test claim: PA required; PA started via CoverMyMeds. KEY [id number] . Waiting for clinical questions to populate.

## 2024-08-01 NOTE — Telephone Encounter (Signed)
 Clinical questions have been answered and PA submitted. PA currently Pending. Please be advised that most companies allow up to 30 days to make a decision. We will advise when a determination has been made, or follow up in 1 week.   Please reach out to our team, Rx Prior Auth Pool, if you haven't heard back in a week.

## 2024-08-04 ENCOUNTER — Other Ambulatory Visit (HOSPITAL_COMMUNITY): Payer: Self-pay

## 2024-08-04 NOTE — Telephone Encounter (Signed)
 Pharmacy Patient Advocate Encounter  Received notification from [facility] that Prior Authorization for Mounjaro  12.5 has been APPROVED from 08/01/24 to 08/01/25. Ran test claim, Copay is $25.00. This test claim was processed through [facility]- copay amounts may vary at other pharmacies due to pharmacy/plan contracts, or as the patient moves through the different stages of their insurance plan.   PA #/Case ID/Reference #: # [id number]

## 2024-08-06 ENCOUNTER — Other Ambulatory Visit (HOSPITAL_COMMUNITY): Payer: Self-pay

## 2024-08-21 ENCOUNTER — Ambulatory Visit: Admitting: Internal Medicine

## 2024-10-13 ENCOUNTER — Ambulatory Visit: Payer: PRIVATE HEALTH INSURANCE | Admitting: Obstetrics and Gynecology

## 2024-10-23 ENCOUNTER — Encounter: Admitting: Internal Medicine
